# Patient Record
Sex: Male | Born: 1990 | Race: Black or African American | Hispanic: No | Marital: Single | Smoking: Former smoker
Health system: Southern US, Community
[De-identification: ages and names within clinical notes are randomized; demographics above are authoritative.]

## PROBLEM LIST (undated history)

## (undated) DIAGNOSIS — Z789 Other specified health status: Secondary | ICD-10-CM

## (undated) HISTORY — PX: WISDOM TOOTH EXTRACTION: SHX21

## (undated) HISTORY — PX: APPENDECTOMY: SHX54

---

## 2009-08-18 ENCOUNTER — Emergency Department: Payer: Self-pay | Admitting: Emergency Medicine

## 2011-12-01 ENCOUNTER — Emergency Department: Payer: Self-pay | Admitting: Emergency Medicine

## 2013-06-15 ENCOUNTER — Emergency Department: Payer: Self-pay | Admitting: Emergency Medicine

## 2013-06-16 ENCOUNTER — Emergency Department: Payer: Self-pay

## 2013-11-27 ENCOUNTER — Ambulatory Visit: Payer: Self-pay | Admitting: Family Medicine

## 2013-11-27 LAB — GC/CHLAMYDIA PROBE AMP

## 2017-08-25 ENCOUNTER — Emergency Department
Admission: EM | Admit: 2017-08-25 | Discharge: 2017-08-25 | Disposition: A | Discharge status: Home / Self Care | Payer: Self-pay | Attending: Emergency Medicine | Admitting: Emergency Medicine

## 2017-08-25 ENCOUNTER — Emergency Department: Payer: Self-pay

## 2017-08-25 DIAGNOSIS — L03115 Cellulitis of right lower limb: Secondary | ICD-10-CM | POA: Insufficient documentation

## 2017-08-25 DIAGNOSIS — Z87891 Personal history of nicotine dependence: Secondary | ICD-10-CM | POA: Insufficient documentation

## 2017-08-25 DIAGNOSIS — L03119 Cellulitis of unspecified part of limb: Secondary | ICD-10-CM

## 2017-08-25 MED ORDER — TRAMADOL HCL 50 MG PO TABS: 50.0000 mg | ORAL_TABLET | Freq: Four times a day (QID) | ORAL | 0 refills | Status: DC | PRN

## 2017-08-25 MED ORDER — CEPHALEXIN 500 MG PO CAPS: 500.0000 mg | ORAL_CAPSULE | Freq: Four times a day (QID) | ORAL | 0 refills | Status: DC

## 2017-08-25 MED ORDER — TRAMADOL HCL 50 MG PO TABS
50.0000 mg | ORAL_TABLET | Freq: Once | ORAL | Status: AC
  Administered 2017-08-25: 50 mg via ORAL
  Filled 2017-08-25: qty 1

## 2017-08-25 MED ORDER — CEPHALEXIN 500 MG PO CAPS
500.0000 mg | ORAL_CAPSULE | Freq: Once | ORAL | Status: AC
  Administered 2017-08-25: 500 mg via ORAL
  Filled 2017-08-25: qty 1

## 2017-08-25 MED ORDER — CEPHALEXIN 500 MG PO CAPS: 500.0000 mg | ORAL_CAPSULE | Freq: Four times a day (QID) | ORAL | 0 refills | Status: AC

## 2017-08-25 NOTE — ED Triage Notes (Addendum)
Patient c/o right foot pain/swelling X 2 days. Patient c/o redness/warmth to foot beginning today. Patient denies fall/injury.
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
## 2017-08-25 NOTE — Discharge Instructions (Addendum)

## 2017-08-25 NOTE — ED Notes (Signed)
Red area on right foot marked with skin pen.  Pt advised to watch area and return for any extensive swelling or redness.  Pt verbalized understanding.

## 2017-08-25 NOTE — ED Provider Notes (Signed)
[facility] Emergency Department Provider Note  ____________________________________________  Time seen: Approximately 7:09 AM  I have reviewed the triage vital signs and the nursing notes.   HISTORY  Chief Complaint Foot Pain   HPI Mark Charna BusmanJermaine Allison is a 27 y.o. male with no significant past medical history who presents for evaluation of right foot pain and swelling.  Patient noticed a raised area on the dorsal aspect of his right foot few days ago.  He reports that initially had a little whitehead however that drained by itself.  It was not pruritic in nature.  This morning when he woke up he noticed that the foot was red and swollen and painful.  His complain of 7 out of 10 throbbing pain that is worse with weightbearing.  The redness has spread since this morning.  No fever or chills, no nausea or vomiting.  Patient is not a diabetic.  He denies trauma to his foot.   Past Surgical History:  Procedure Laterality Date  . APPENDECTOMY    . WISDOM TOOTH EXTRACTION      Allergies Patient has no known allergies.  No family history on file.  Social History Social History   Tobacco Use  . Smoking status: Former Games developermoker  . Smokeless tobacco: Never Used  Substance Use Topics  . Alcohol use: Yes  . Drug use: Never    Review of Systems  Constitutional: Negative for fever. Eyes: Negative for visual changes. ENT: Negative for sore throat. Neck: No neck pain  Cardiovascular: Negative for chest pain. Respiratory: Negative for shortness of breath. Gastrointestinal: Negative for abdominal pain, vomiting or diarrhea. Genitourinary: Negative for dysuria. Musculoskeletal: Negative for back pain. + pain and redness of the R foot Skin: Negative for rash. Neurological: Negative for headaches, weakness or numbness. Psych: No SI or HI  ____________________________________________   PHYSICAL EXAM:  VITAL SIGNS: ED Triage Vitals [08/25/17 0640]    Enc Vitals Group     BP 135/83     Pulse Rate 90     Resp 18     Temp 98.4 F (36.9 C)     Temp Source Oral     SpO2 96 %     Weight 210 lb (95.3 kg)     Height 6\' 2"  (1.88 m)     Head Circumference      Peak Flow      Pain Score 8     Pain Loc      Pain Edu?      Excl. in GC?     Constitutional: Alert and oriented. Well appearing and in no apparent distress. HEENT:      Head: Normocephalic and atraumatic.         Eyes: Conjunctivae are normal. Sclera is non-icteric.       Mouth/Throat: Mucous membranes are moist.       Neck: Supple with no signs of meningismus. Cardiovascular: Regular rate and rhythm. No murmurs, gallops, or rubs. 2+ symmetrical distal pulses are present in all extremities. No JVD. Respiratory: Normal respiratory effort. Lungs are clear to auscultation bilaterally. No wheezes, crackles, or rhonchi.  Musculoskeletal: Dorsum of the right foot is erythematous and warm, no abscess, crepitus, or fluctuance, diffusely tender to palpation Neurologic: Normal speech and language. Face is symmetric. Moving all extremities. No gross focal neurologic deficits are appreciated. Skin: Skin is warm, dry and intact. No rash noted. Psychiatric: Mood and affect are normal. Speech and behavior are normal.  ____________________________________________   LABS (  all labs ordered are listed, but only abnormal results are displayed)  Labs Reviewed - No data to display ____________________________________________  EKG  none  ____________________________________________  RADIOLOGY  I have personally reviewed the images performed during this visit and I agree with the Radiologist's read.   Interpretation by Radiologist:  Dg Foot Complete Right  Result Date: 08/25/2017 CLINICAL DATA:  Dorsal foot pain. EXAM: RIGHT FOOT COMPLETE - 3+ VIEW COMPARISON:  None. FINDINGS: There is no evidence of fracture or dislocation. There is no evidence of arthropathy or other focal bone  abnormality. Soft tissues are unremarkable. IMPRESSION: Normal radiographs. Electronically Signed   By: Paulina Fusi M.D.   On: 08/25/2017 07:08     ____________________________________________   PROCEDURES  Procedure(s) performed: None Procedures Critical Care performed:  None ____________________________________________   INITIAL IMPRESSION / ASSESSMENT AND PLAN / ED COURSE   27 y.o. male with no significant past medical history who presents for evaluation of right foot pain and swelling.  Presentation is concerning for nonpurulent cellulitis.  Patient is otherwise young and healthy with no fever no systemic signs.  X-ray showing no foreign body.  Area was demarcated and patient was started on Keflex.  Discussed return precautions for spreading of the redness, nausea or vomiting, or fever.  Patient be provided with a short prescription for tramadol for pain.      As part of my medical decision making, I reviewed the following data within the electronic MEDICAL RECORD NUMBER Nursing notes reviewed and incorporated, Radiograph reviewed , Notes from prior ED visits and [place] Controlled Substance Database    Pertinent labs & imaging results that were available during my care of the patient were reviewed by me and considered in my medical decision making (see chart for details).    ____________________________________________   FINAL CLINICAL IMPRESSION(S) / ED DIAGNOSES  Final diagnoses:  Cellulitis of foot      NEW MEDICATIONS STARTED DURING THIS VISIT:  ED Discharge Orders        Ordered    cephALEXin (KEFLEX) 500 MG capsule  4 times daily     08/25/17 0721    traMADol (ULTRAM) 50 MG tablet  Every 6 hours PRN     08/25/17 1610       Note:  This document was prepared using Dragon voice recognition software and may include unintentional dictation errors.    Nita Sickle, MD 08/25/17 [phone]

## 2017-10-26 ENCOUNTER — Encounter: Payer: Self-pay | Admitting: Emergency Medicine

## 2017-10-26 ENCOUNTER — Other Ambulatory Visit: Payer: Self-pay

## 2017-10-26 ENCOUNTER — Emergency Department
Admission: EM | Admit: 2017-10-26 | Discharge: 2017-10-26 | Disposition: A | Discharge status: Home / Self Care | Payer: Self-pay | Attending: Emergency Medicine | Admitting: Emergency Medicine

## 2017-10-26 DIAGNOSIS — Z87891 Personal history of nicotine dependence: Secondary | ICD-10-CM | POA: Insufficient documentation

## 2017-10-26 DIAGNOSIS — K529 Noninfective gastroenteritis and colitis, unspecified: Secondary | ICD-10-CM | POA: Insufficient documentation

## 2017-10-26 MED ORDER — ONDANSETRON 4 MG PO TBDP: 4.0000 mg | ORAL_TABLET | Freq: Three times a day (TID) | ORAL | 0 refills | Status: DC | PRN

## 2017-10-26 MED ORDER — ONDANSETRON 4 MG PO TBDP
4.0000 mg | ORAL_TABLET | Freq: Once | ORAL | Status: AC
  Administered 2017-10-26: 4 mg via ORAL
  Filled 2017-10-26: qty 1

## 2017-10-26 NOTE — Discharge Instructions (Addendum)
Follow-up with your primary care provider if any continued problems.  Remain on clear liquids today and slowly add foods tomorrow such as bananas, rice, applesauce and plain toast.  Zofran if needed for nausea every 8 hours.  Tylenol if needed for fever.
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
## 2017-10-26 NOTE — ED Triage Notes (Signed)
Says this am has diarrhea and vomiting.  Could not go to work.  (vomx2)  Says his mother was feeling ill yesterday.  Pt in nad.

## 2017-10-26 NOTE — ED Notes (Signed)
See triage note  States he developed some vomiting and diarrhea yesterday  States he vomited times 2

## 2018-03-11 ENCOUNTER — Emergency Department (HOSPITAL_COMMUNITY)
Admission: EM | Admit: 2018-03-11 | Discharge: 2018-03-11 | Disposition: A | Discharge status: Home / Self Care | Payer: No Typology Code available for payment source | Attending: Emergency Medicine | Admitting: Emergency Medicine

## 2018-03-11 ENCOUNTER — Emergency Department (HOSPITAL_COMMUNITY): Payer: No Typology Code available for payment source

## 2018-03-11 ENCOUNTER — Other Ambulatory Visit: Payer: Self-pay

## 2018-03-11 ENCOUNTER — Encounter (HOSPITAL_COMMUNITY): Payer: Self-pay | Admitting: Emergency Medicine

## 2018-03-11 DIAGNOSIS — R4182 Altered mental status, unspecified: Secondary | ICD-10-CM | POA: Diagnosis not present

## 2018-03-11 DIAGNOSIS — Z87891 Personal history of nicotine dependence: Secondary | ICD-10-CM | POA: Diagnosis not present

## 2018-03-11 DIAGNOSIS — S30811A Abrasion of abdominal wall, initial encounter: Secondary | ICD-10-CM | POA: Insufficient documentation

## 2018-03-11 DIAGNOSIS — Y999 Unspecified external cause status: Secondary | ICD-10-CM | POA: Insufficient documentation

## 2018-03-11 DIAGNOSIS — Z23 Encounter for immunization: Secondary | ICD-10-CM | POA: Diagnosis not present

## 2018-03-11 DIAGNOSIS — Y92410 Unspecified street and highway as the place of occurrence of the external cause: Secondary | ICD-10-CM | POA: Diagnosis not present

## 2018-03-11 DIAGNOSIS — H5713 Ocular pain, bilateral: Secondary | ICD-10-CM | POA: Diagnosis not present

## 2018-03-11 DIAGNOSIS — S8991XA Unspecified injury of right lower leg, initial encounter: Secondary | ICD-10-CM | POA: Diagnosis present

## 2018-03-11 DIAGNOSIS — S8391XA Sprain of unspecified site of right knee, initial encounter: Secondary | ICD-10-CM | POA: Diagnosis not present

## 2018-03-11 DIAGNOSIS — S63502A Unspecified sprain of left wrist, initial encounter: Secondary | ICD-10-CM

## 2018-03-11 DIAGNOSIS — T07XXXA Unspecified multiple injuries, initial encounter: Secondary | ICD-10-CM

## 2018-03-11 DIAGNOSIS — Y9389 Activity, other specified: Secondary | ICD-10-CM | POA: Insufficient documentation

## 2018-03-11 MED ORDER — HYDROCODONE-ACETAMINOPHEN 5-325 MG PO TABS
1.0000 | ORAL_TABLET | Freq: Once | ORAL | Status: AC
  Administered 2018-03-11: 1 via ORAL
  Filled 2018-03-11: qty 1

## 2018-03-11 MED ORDER — TETANUS-DIPHTH-ACELL PERTUSSIS 5-2.5-18.5 LF-MCG/0.5 IM SUSP
0.5000 mL | Freq: Once | INTRAMUSCULAR | Status: AC
Start: 2018-03-11 — End: 2018-03-11
  Administered 2018-03-11: 0.5 mL via INTRAMUSCULAR
  Filled 2018-03-11: qty 0.5

## 2018-03-11 MED ORDER — TETRACAINE HCL 0.5 % OP SOLN
1.0000 [drp] | Freq: Once | OPHTHALMIC | Status: AC
  Administered 2018-03-11: 1 [drp] via OPHTHALMIC
  Filled 2018-03-11: qty 4

## 2018-03-11 MED ORDER — SILVER SULFADIAZINE 1 % EX CREA
TOPICAL_CREAM | Freq: Once | CUTANEOUS | Status: AC
  Administered 2018-03-11: 1 via TOPICAL
  Filled 2018-03-11: qty 85

## 2018-03-11 MED ORDER — METHOCARBAMOL 500 MG PO TABS: 500.0000 mg | ORAL_TABLET | Freq: Two times a day (BID) | ORAL | 0 refills | Status: DC

## 2018-03-11 MED ORDER — ERYTHROMYCIN 5 MG/GM OP OINT: TOPICAL_OINTMENT | OPHTHALMIC | 0 refills | Status: DC

## 2018-03-11 MED ORDER — FLUORESCEIN SODIUM 1 MG OP STRP
1.0000 | ORAL_STRIP | Freq: Once | OPHTHALMIC | Status: AC
  Administered 2018-03-11: 1 via OPHTHALMIC
  Filled 2018-03-11: qty 1

## 2018-03-11 MED ORDER — ACETAMINOPHEN 500 MG PO TABS
1000.0000 mg | ORAL_TABLET | Freq: Once | ORAL | Status: AC
  Administered 2018-03-11: 1000 mg via ORAL
  Filled 2018-03-11: qty 2

## 2018-03-11 MED ORDER — TETRACAINE HCL 0.5 % OP SOLN
1.0000 [drp] | Freq: Once | OPHTHALMIC | Status: AC
  Administered 2018-03-11: 1 [drp] via OPHTHALMIC

## 2018-03-11 NOTE — ED Notes (Signed)
Pt endorses ETOH and marijuana tonight

## 2018-03-11 NOTE — ED Notes (Signed)
ED Provider at bedside. 
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
## 2018-03-11 NOTE — Progress Notes (Signed)
Discharge instructions (including medications) discussed with and copy provided to patient/caregiver. Pt unable to sign due to lack of signature pad.   Pt d/c with family to home.
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
## 2018-03-11 NOTE — Progress Notes (Signed)
Orthopedic Tech Progress Note Patient Details:  Mark Allison Mar 09, 1990 [id number]  Ortho Devices Type of Ortho Device: Crutches, Knee Immobilizer, Velcro wrist splint Ortho Device/Splint Interventions: Adjustment, Application, Ordered   Post Interventions Patient Tolerated: Well Instructions Provided: Care of device, Adjustment of device   Donald Pore 03/11/2018, 10:29 AM

## 2018-03-22 ENCOUNTER — Emergency Department (HOSPITAL_COMMUNITY)
Admission: EM | Admit: 2018-03-22 | Discharge: 2018-03-22 | Disposition: A | Discharge status: Home / Self Care | Payer: Self-pay | Attending: Emergency Medicine | Admitting: Emergency Medicine

## 2018-03-22 ENCOUNTER — Emergency Department (HOSPITAL_BASED_OUTPATIENT_CLINIC_OR_DEPARTMENT_OTHER): Payer: Self-pay

## 2018-03-22 ENCOUNTER — Encounter (HOSPITAL_COMMUNITY): Payer: Self-pay | Admitting: Emergency Medicine

## 2018-03-22 ENCOUNTER — Emergency Department (HOSPITAL_COMMUNITY): Payer: Self-pay

## 2018-03-22 ENCOUNTER — Other Ambulatory Visit: Payer: Self-pay

## 2018-03-22 DIAGNOSIS — R600 Localized edema: Secondary | ICD-10-CM | POA: Insufficient documentation

## 2018-03-22 DIAGNOSIS — M25461 Effusion, right knee: Secondary | ICD-10-CM | POA: Insufficient documentation

## 2018-03-22 DIAGNOSIS — Z87891 Personal history of nicotine dependence: Secondary | ICD-10-CM | POA: Insufficient documentation

## 2018-03-22 DIAGNOSIS — Z79899 Other long term (current) drug therapy: Secondary | ICD-10-CM | POA: Insufficient documentation

## 2018-03-22 DIAGNOSIS — M25561 Pain in right knee: Secondary | ICD-10-CM | POA: Insufficient documentation

## 2018-03-22 DIAGNOSIS — R609 Edema, unspecified: Secondary | ICD-10-CM

## 2018-03-22 MED ORDER — IBUPROFEN 800 MG PO TABS
800.0000 mg | ORAL_TABLET | Freq: Once | ORAL | Status: AC
  Administered 2018-03-22: 800 mg via ORAL
  Filled 2018-03-22: qty 1

## 2018-03-22 MED ORDER — KETOROLAC TROMETHAMINE 15 MG/ML IJ SOLN
15.0000 mg | Freq: Once | INTRAMUSCULAR | Status: DC
  Filled 2018-03-22: qty 1

## 2018-03-22 MED ORDER — IBUPROFEN 400 MG PO TABS: 600.0000 mg | ORAL_TABLET | Freq: Once | ORAL | Status: DC

## 2018-03-22 NOTE — Discharge Instructions (Addendum)
Use the Ace wrap and crutches for weightbearing as tolerated.  Rest, ice, compress and elevate the area.  You probably need an MRI of your right knee.  Follow-up with orthopedics have given your referral.  Return to ED with worsening symptoms.  Can take Motrin and Tylenol around-the-clock to help with your pain.
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
## 2018-03-22 NOTE — ED Notes (Signed)
Patient verbalizes understanding of discharge instructions. Opportunity for questioning and answers were provided. Armband removed by staff, pt discharged from ED.  

## 2018-03-22 NOTE — Progress Notes (Signed)
RLE venous duplex       has been completed. Preliminary results can be found under CV proc through chart review. Denissa Cozart, BS, RDMS, RVT   
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
## 2019-01-23 ENCOUNTER — Emergency Department
Admission: EM | Admit: 2019-01-23 | Discharge: 2019-01-23 | Disposition: A | Discharge status: Home / Self Care | Payer: Self-pay | Attending: Emergency Medicine | Admitting: Emergency Medicine

## 2019-01-23 ENCOUNTER — Emergency Department: Payer: Self-pay

## 2019-01-23 ENCOUNTER — Other Ambulatory Visit: Payer: Self-pay

## 2019-01-23 DIAGNOSIS — F1210 Cannabis abuse, uncomplicated: Secondary | ICD-10-CM | POA: Insufficient documentation

## 2019-01-23 DIAGNOSIS — Z87891 Personal history of nicotine dependence: Secondary | ICD-10-CM | POA: Insufficient documentation

## 2019-01-23 DIAGNOSIS — U071 COVID-19: Secondary | ICD-10-CM | POA: Insufficient documentation

## 2019-01-23 DIAGNOSIS — J069 Acute upper respiratory infection, unspecified: Secondary | ICD-10-CM | POA: Insufficient documentation

## 2019-01-23 LAB — BASIC METABOLIC PANEL
Anion gap: 11 (ref 5–15)
BUN: 12 mg/dL (ref 6–20)
CO2: 27 mmol/L (ref 22–32)
Calcium: 9.5 mg/dL (ref 8.9–10.3)
Chloride: 97 mmol/L — ABNORMAL LOW (ref 98–111)
Creatinine, Ser: 1.15 mg/dL (ref 0.61–1.24)
GFR calc Af Amer: >60 mL/min (ref >60)
GFR calc non Af Amer: >60 mL/min (ref >60)
Glucose, Bld: 118 mg/dL — ABNORMAL HIGH (ref 70–99)
Potassium: 4.2 mmol/L (ref 3.5–5.1)
Sodium: 135 mmol/L (ref 135–145)

## 2019-01-23 LAB — CBC
HCT: 50 % (ref 39.0–52.0)
Hemoglobin: 18.2 g/dL — ABNORMAL HIGH (ref 13.0–17.0)
MCH: 31.4 pg (ref 26.0–34.0)
MCHC: 36.4 g/dL — ABNORMAL HIGH (ref 30.0–36.0)
MCV: 86.2 fL (ref 80.0–100.0)
Platelets: 160 10*3/uL (ref 150–400)
RBC: 5.8 MIL/uL (ref 4.22–5.81)
RDW: 11.9 % (ref 11.5–15.5)
WBC: 4.9 10*3/uL (ref 4.0–10.5)
nRBC: 0 % (ref 0.0–0.2)

## 2019-01-23 LAB — TROPONIN I (HIGH SENSITIVITY): Troponin I (High Sensitivity): 3 ng/L (ref <18)

## 2019-01-23 MED ORDER — BENZONATATE 100 MG PO CAPS: 100.0000 mg | ORAL_CAPSULE | Freq: Three times a day (TID) | ORAL | 0 refills | Status: DC | PRN

## 2019-01-23 MED ORDER — ACETAMINOPHEN 500 MG PO TABS
1000.0000 mg | ORAL_TABLET | Freq: Once | ORAL | Status: AC
  Administered 2019-01-23: 1000 mg via ORAL
  Filled 2019-01-23: qty 2

## 2019-01-23 MED ORDER — AZITHROMYCIN 250 MG PO TABS: ORAL_TABLET | ORAL | 0 refills | Status: DC

## 2019-01-23 MED ORDER — SODIUM CHLORIDE 0.9% FLUSH: 3.0000 mL | Freq: Once | INTRAVENOUS | Status: DC

## 2019-01-23 MED ORDER — DEXAMETHASONE 4 MG PO TABS
10.0000 mg | ORAL_TABLET | Freq: Once | ORAL | Status: AC
  Administered 2019-01-23: 10 mg via ORAL
  Filled 2019-01-23 (×2): qty 2.5

## 2019-01-23 MED ORDER — PROMETHAZINE-DM 6.25-15 MG/5ML PO SYRP: 5.0000 mL | ORAL_SOLUTION | Freq: Three times a day (TID) | ORAL | 0 refills | Status: DC | PRN

## 2019-01-23 NOTE — Discharge Instructions (Addendum)
Person Under Monitoring Name: Mark Allison  Location: [place] [id number]   Infection Prevention Recommendations for Individuals Confirmed to have, or Being Evaluated for, 2019 Novel Coronavirus (COVID-19) Infection Who Receive Care at Home  Individuals who are confirmed to have, or are being evaluated for, COVID-19 should follow the prevention steps below until a healthcare provider or local or state health department says they can return to normal activities.  Stay home except to get medical care You should restrict activities outside your home, except for getting medical care. Do not go to work, school, or public areas, and do not use public transportation or taxis.  Call ahead before visiting your doctor Before your medical appointment, call the healthcare provider and tell them that you have, or are being evaluated for, COVID-19 infection. This will help the healthcare providers office take steps to keep other people from getting infected. Ask your healthcare provider to call the local or state health department.  Monitor your symptoms Seek prompt medical attention if your illness is worsening (e.g., difficulty breathing). Before going to your medical appointment, call the healthcare provider and tell them that you have, or are being evaluated for, COVID-19 infection. Ask your healthcare provider to call the local or state health department.  Wear a facemask You should wear a facemask that covers your nose and mouth when you are in the same room with other people and when you visit a healthcare provider. People who live with or visit you should also wear a facemask while they are in the same room with you.  Separate yourself from other people in your home As much as possible, you should stay in a different room from other people in your home. Also, you should use a separate bathroom, if available.  Avoid sharing household items You  should not share dishes, drinking glasses, cups, eating utensils, towels, bedding, or other items with other people in your home. After using these items, you should wash them thoroughly with soap and water.  Cover your coughs and sneezes Cover your mouth and nose with a tissue when you cough or sneeze, or you can cough or sneeze into your sleeve. Throw used tissues in a lined trash can, and immediately wash your hands with soap and water for at least 20 seconds or use an alcohol-based hand rub.  Wash your [facility] your hands often and thoroughly with soap and water for at least 20 seconds. You can use an alcohol-based hand sanitizer if soap and water are not available and if your hands are not visibly dirty. Avoid touching your eyes, nose, and mouth with unwashed hands.   Prevention Steps for Caregivers and Household Members of Individuals Confirmed to have, or Being Evaluated for, COVID-19 Infection Being Cared for in the Home  If you live with, or provide care at home for, a person confirmed to have, or being evaluated for, COVID-19 infection please follow these guidelines to prevent infection:  Follow healthcare providers instructions Make sure that you understand and can help the patient follow any healthcare provider instructions for all care.  Provide for the patients basic needs You should help the patient with basic needs in the home and provide support for getting groceries, prescriptions, and other personal needs.  Monitor the patients symptoms If they are getting sicker, call his or her medical provider and tell them that the patient has, or is being evaluated for, COVID-19 infection. This will help the healthcare  providers office take steps to keep other people from getting infected. Ask the healthcare provider to call the local or state health department.  Limit the number of people who have contact with the patient If possible, have only one caregiver for the  patient. Other household members should stay in another home or place of residence. If this is not possible, they should stay in another room, or be separated from the patient as much as possible. Use a separate bathroom, if available. Restrict visitors who do not have an essential need to be in the home.  Keep older adults, very young children, and other sick people away from the patient Keep older adults, very young children, and those who have compromised immune systems or chronic health conditions away from the patient. This includes people with chronic heart, lung, or kidney conditions, diabetes, and cancer.  Ensure good ventilation Make sure that shared spaces in the home have good air flow, such as from an air conditioner or an opened window, weather permitting.  Wash your hands often Wash your hands often and thoroughly with soap and water for at least 20 seconds. You can use an alcohol based hand sanitizer if soap and water are not available and if your hands are not visibly dirty. Avoid touching your eyes, nose, and mouth with unwashed hands. Use disposable paper towels to dry your hands. If not available, use dedicated cloth towels and replace them when they become wet.  Wear a facemask and gloves Wear a disposable facemask at all times in the room and gloves when you touch or have contact with the patients blood, body fluids, and/or secretions or excretions, such as sweat, saliva, sputum, nasal mucus, vomit, urine, or feces.  Ensure the mask fits over your nose and mouth tightly, and do not touch it during use. Throw out disposable facemasks and gloves after using them. Do not reuse. Wash your hands immediately after removing your facemask and gloves. If your personal clothing becomes contaminated, carefully remove clothing and launder. Wash your hands after handling contaminated clothing. Place all used disposable facemasks, gloves, and other waste in a lined container before  disposing them with other household waste. Remove gloves and wash your hands immediately after handling these items.  Do not share dishes, glasses, or other household items with the patient Avoid sharing household items. You should not share dishes, drinking glasses, cups, eating utensils, towels, bedding, or other items with a patient who is confirmed to have, or being evaluated for, COVID-19 infection. After the person uses these items, you should wash them thoroughly with soap and water.  Wash laundry thoroughly Immediately remove and wash clothes or bedding that have blood, body fluids, and/or secretions or excretions, such as sweat, saliva, sputum, nasal mucus, vomit, urine, or feces, on them. Wear gloves when handling laundry from the patient. Read and follow directions on labels of laundry or clothing items and detergent. In general, wash and dry with the warmest temperatures recommended on the label.  Clean all areas the individual has used often Clean all touchable surfaces, such as counters, tabletops, doorknobs, bathroom fixtures, toilets, phones, keyboards, tablets, and bedside tables, every day. Also, clean any surfaces that may have blood, body fluids, and/or secretions or excretions on them. Wear gloves when cleaning surfaces the patient has come in contact with. Use a diluted bleach solution (e.g., dilute bleach with 1 part bleach and 10 parts water) or a household disinfectant with a label that says EPA-registered for coronaviruses. To make  a bleach solution at home, add 1 tablespoon of bleach to 1 quart (4 cups) of water. For a larger supply, add  cup of bleach to 1 gallon (16 cups) of water. Read labels of cleaning products and follow recommendations provided on product labels. Labels contain instructions for safe and effective use of the cleaning product including precautions you should take when applying the product, such as wearing gloves or eye protection and making sure you  have good ventilation during use of the product. Remove gloves and wash hands immediately after cleaning.  Monitor yourself for signs and symptoms of illness Caregivers and household members are considered close contacts, should monitor their health, and will be asked to limit movement outside of the home to the extent possible. Follow the monitoring steps for close contacts listed on the symptom monitoring form.   ? If you have additional questions, contact your local health department or call the epidemiologist on call at [phone] (available 24/7). ? This guidance is subject to change. For the most up-to-date guidance from [facility], please refer to their website: [email or web address]

## 2019-01-23 NOTE — ED Provider Notes (Signed)
[facility] Emergency Department Provider Note  ____________________________________________   First MD Initiated Contact with Patient 01/23/19 1638     (approximate)  I have reviewed the triage vital signs and the nursing notes.   HISTORY  Chief Complaint Shortness of Breath    HPI Mark Allison is a 28 y.o. male       28 yo M here with cough, mild SOB, fever, rhinorrhea. Pt reports that his sx started 4 days ago as mild sore throat, rhinorrhea, and dry cough. Since then, he's had persistent dry cough without significant SOB. He reports he has developed worsening fever, chills over the past 48 hours. His SOB is w/ exertion only and minimal, not at rest. His cough has persisted. Denies known sick contacts but he has been "out and about." No abd pain but has had some loose BMs. No nausea, vomiting. No abd pain.   History reviewed. No pertinent past medical history.  There are no active problems to display for this patient.   Past Surgical History:  Procedure Laterality Date  . APPENDECTOMY    . WISDOM TOOTH EXTRACTION      Prior to Admission medications   Medication Sig Start Date End Date Taking? Authorizing Provider  erythromycin ophthalmic ointment Place a 1/2 inch ribbon of ointment into the lower eyelid four times a day x 5 days 03/11/18   Maxwell CaulLayden, Lindsey A, PA-C  methocarbamol (ROBAXIN) 500 MG tablet Take 1 tablet (500 mg total) by mouth 2 (two) times daily. 03/11/18   Maxwell CaulLayden, Lindsey A, PA-C  ondansetron (ZOFRAN ODT) 4 MG disintegrating tablet Take 1 tablet (4 mg total) by mouth every 8 (eight) hours as needed for nausea or vomiting. 10/26/17   Tommi RumpsSummers, Rhonda L, PA-C    Allergies Patient has no known allergies.  No family history on file.  Social History Social History   Tobacco Use  . Smoking status: Former Games developermoker  . Smokeless tobacco: Never Used  Substance Use Topics  . Alcohol use: Yes  . Drug use: Yes    Types:  Marijuana    Review of Systems  Review of Systems  Constitutional: Positive for chills and fatigue. Negative for fever.  HENT: Positive for congestion and rhinorrhea. Negative for sore throat.   Respiratory: Positive for cough and shortness of breath.   Cardiovascular: Negative for chest pain.  Gastrointestinal: Positive for diarrhea. Negative for abdominal pain.  Genitourinary: Negative for flank pain.  Musculoskeletal: Negative for neck pain.  Skin: Negative for rash and wound.  Allergic/Immunologic: Negative for immunocompromised state.  Neurological: Negative for weakness and numbness.  Hematological: Does not bruise/bleed easily.  All other systems reviewed and are negative.    ____________________________________________  PHYSICAL EXAM:      VITAL SIGNS: ED Triage Vitals  Enc Vitals Group     BP 01/23/19 1521 132/73     Pulse Rate 01/23/19 1521 (!) 110     Resp 01/23/19 1521 18     Temp 01/23/19 1521 99.4 F (37.4 C)     Temp src --      SpO2 01/23/19 1521 100 %     Weight 01/23/19 1522 205 lb (93 kg)     Height 01/23/19 1522 6\' 3"  (1.905 m)     Head Circumference --      Peak Flow --      Pain Score 01/23/19 1522 5     Pain Loc --      Pain Edu? --  Excl. in GC? --      Physical Exam Vitals signs and nursing note reviewed.  Constitutional:      General: He is not in acute distress.    Appearance: He is well-developed.  HENT:     Head: Normocephalic and atraumatic.  Eyes:     Conjunctiva/sclera: Conjunctivae normal.  Neck:     Musculoskeletal: Neck supple.  Cardiovascular:     Rate and Rhythm: Regular rhythm. Tachycardia present.     Heart sounds: Normal heart sounds. No murmur. No friction rub.  Pulmonary:     Effort: Pulmonary effort is normal. No respiratory distress.     Breath sounds: Rales (scattered, minimal and clear w/ coughing) present. No wheezing.  Abdominal:     General: There is no distension.     Palpations: Abdomen is soft.      Tenderness: There is no abdominal tenderness.  Skin:    General: Skin is warm.     Capillary Refill: Capillary refill takes less than 2 seconds.  Neurological:     Mental Status: He is alert and oriented to person, place, and time.     Motor: No abnormal muscle tone.       ____________________________________________   LABS (all labs ordered are listed, but only abnormal results are displayed)  Labs Reviewed  BASIC METABOLIC PANEL - Abnormal; Notable for the following components:      Result Value   Chloride 97 (*)    Glucose, Bld 118 (*)    All other components within normal limits  CBC - Abnormal; Notable for the following components:   Hemoglobin 18.2 (*)    MCHC 36.4 (*)    All other components within normal limits  SARS CORONAVIRUS 2 (TAT 6-24 HRS)  TROPONIN I (HIGH SENSITIVITY)  TROPONIN I (HIGH SENSITIVITY)    ____________________________________________  EKG: Sinus tachycardia, VR 107. PR 142, QRS 66, QTc 411. No ST elevations or depressions. No signs of ischemia or infarct. ________________________________________  RADIOLOGY All imaging, including plain films, CT scans, and ultrasounds, independently reviewed by me, and interpretations confirmed via formal radiology reads.  ED MD interpretation:   CXR: Clear, no PNA  Official radiology report(s): Dg Chest 2 View  Result Date: 01/23/2019 CLINICAL DATA:  Shortness of breath EXAM: CHEST - 2 VIEW COMPARISON:  03/11/2018 FINDINGS: The heart size and mediastinal contours are within normal limits. Both lungs are clear. The visualized skeletal structures are unremarkable. IMPRESSION: No active cardiopulmonary disease. Electronically Signed   By: Jasmine Pang M.D.   On: 01/23/2019 15:39    ____________________________________________  PROCEDURES   Procedure(s) performed (including Critical Care):  Procedures  ____________________________________________  INITIAL IMPRESSION / MDM / ASSESSMENT AND PLAN / ED  COURSE  As part of my medical decision making, I reviewed the following data within the electronic MEDICAL RECORD NUMBER Nursing notes reviewed and incorporated, Old chart reviewed, Notes from prior ED visits, and [place] Controlled Substance Database       *Mark Allison was evaluated in Emergency Department on 01/23/2019 for the symptoms described in the history of present illness. He was evaluated in the context of the global COVID-19 pandemic, which necessitated consideration that the patient might be at risk for infection with the SARS-CoV-2 virus that causes COVID-19. Institutional protocols and algorithms that pertain to the evaluation of patients at risk for COVID-19 are in a state of rapid change based on information released by regulatory bodies including the CDC and federal and state organizations. These policies and  algorithms were followed during the patient's care in the ED.  Some ED evaluations and interventions may be delayed as a result of limited staffing during the pandemic.*     Medical Decision Making:  28 yo M here with cough, fever, mild SOB w/ exertion. Pt is well appearing in NAD on exam here. Normal WOB. Satting 100% on RA. He is mildly tachycardic which I suspect is 2/2 low-grade temp. Pt has sx highly c/w COVID-19. He is not hypoxic and has no signs of moderate or severe dz. No underlying risk factors. Otherwise, CXR is without significant focal PNA. His labs are reassuring. EKG is nonischemic and trop neg, doubt ACS, pericarditis, myocarditis. No pleurisy, hemoptysis, leg swelling or sx to suggest PE. Will tx for COVID with dose of decadron (hodl on outpt course given absence of hypoxia), supportive care, and good return precautions.  ____________________________________________  FINAL CLINICAL IMPRESSION(S) / ED DIAGNOSES  Final diagnoses:  Viral URI     MEDICATIONS GIVEN DURING THIS VISIT:  Medications  sodium chloride flush (NS) 0.9 % injection 3 mL (has no  administration in time range)  dexamethasone (DECADRON) tablet 10 mg (has no administration in time range)  acetaminophen (TYLENOL) tablet 1,000 mg (has no administration in time range)     ED Discharge Orders    None       Note:  This document was prepared using Dragon voice recognition software and may include unintentional dictation errors.   Duffy Bruce, MD 01/23/19 1726

## 2019-01-23 NOTE — ED Triage Notes (Signed)
Pt comes via POV with c/o fever and some SOB that started Sunday morning. Pt states he went outside yesterday and the cold air made he start to feel worse.  Pt states last night he started with a fever and SOB.  Pt states dry cough. Pt unsure if he has been exposed to [place].

## 2019-01-24 ENCOUNTER — Telehealth: Payer: Self-pay | Admitting: Emergency Medicine

## 2019-01-24 LAB — SARS CORONAVIRUS 2 (TAT 6-24 HRS): SARS Coronavirus 2: POSITIVE — AB

## 2019-01-24 NOTE — Telephone Encounter (Addendum)
Called pateint to inform of positive covid 19 result.  No answer and no voicemail   Patient called me back and he is aware of results and isolation guidelines.

## 2019-06-27 ENCOUNTER — Emergency Department: Payer: Self-pay

## 2019-06-27 ENCOUNTER — Emergency Department
Admission: EM | Admit: 2019-06-27 | Discharge: 2019-06-28 | Disposition: A | Discharge status: Home / Self Care | Payer: Self-pay | Attending: Emergency Medicine | Admitting: Emergency Medicine

## 2019-06-27 ENCOUNTER — Other Ambulatory Visit: Payer: Self-pay

## 2019-06-27 ENCOUNTER — Encounter: Payer: Self-pay | Admitting: Emergency Medicine

## 2019-06-27 DIAGNOSIS — Z87891 Personal history of nicotine dependence: Secondary | ICD-10-CM | POA: Insufficient documentation

## 2019-06-27 DIAGNOSIS — Z23 Encounter for immunization: Secondary | ICD-10-CM | POA: Insufficient documentation

## 2019-06-27 DIAGNOSIS — L03115 Cellulitis of right lower limb: Secondary | ICD-10-CM | POA: Insufficient documentation

## 2019-06-27 LAB — CBC WITH DIFFERENTIAL/PLATELET
Abs Immature Granulocytes: 0.02 10*3/uL (ref 0.00–0.07)
Basophils Absolute: 0 10*3/uL (ref 0.0–0.1)
Basophils Relative: 0 %
Eosinophils Absolute: 0.6 10*3/uL — ABNORMAL HIGH (ref 0.0–0.5)
Eosinophils Relative: 8 %
HCT: 44.7 % (ref 39.0–52.0)
Hemoglobin: 15.8 g/dL (ref 13.0–17.0)
Immature Granulocytes: 0 %
Lymphocytes Relative: 43 %
Lymphs Abs: 3.1 10*3/uL (ref 0.7–4.0)
MCH: 31.5 pg (ref 26.0–34.0)
MCHC: 35.3 g/dL (ref 30.0–36.0)
MCV: 89.2 fL (ref 80.0–100.0)
Monocytes Absolute: 0.4 10*3/uL (ref 0.1–1.0)
Monocytes Relative: 5 %
Neutro Abs: 3.2 10*3/uL (ref 1.7–7.7)
Neutrophils Relative %: 44 %
Platelets: 172 10*3/uL (ref 150–400)
RBC: 5.01 MIL/uL (ref 4.22–5.81)
RDW: 12 % (ref 11.5–15.5)
WBC: 7.2 10*3/uL (ref 4.0–10.5)
nRBC: 0 % (ref 0.0–0.2)

## 2019-06-27 LAB — BASIC METABOLIC PANEL
Anion gap: 8 (ref 5–15)
BUN: 18 mg/dL (ref 6–20)
CO2: 26 mmol/L (ref 22–32)
Calcium: 9 mg/dL (ref 8.9–10.3)
Chloride: 109 mmol/L (ref 98–111)
Creatinine, Ser: 0.94 mg/dL (ref 0.61–1.24)
GFR calc Af Amer: >60 mL/min (ref >60)
GFR calc non Af Amer: >60 mL/min (ref >60)
Glucose, Bld: 99 mg/dL (ref 70–99)
Potassium: 3.8 mmol/L (ref 3.5–5.1)
Sodium: 143 mmol/L (ref 135–145)

## 2019-06-27 MED ORDER — SODIUM CHLORIDE 0.9 % IV SOLN
1.0000 g | Freq: Once | INTRAVENOUS | Status: AC
  Administered 2019-06-27: 1 g via INTRAVENOUS
  Filled 2019-06-27: qty 10

## 2019-06-27 NOTE — ED Triage Notes (Signed)
Pt to triage via w/c with no distress noted, mask in place; tatoo last Friday to rt lower leg; Monday noted redness and has had increased swelling,pain and redness

## 2019-06-27 NOTE — ED Provider Notes (Signed)
[facility] Emergency Department Provider Note  ____________________________________________   First MD Initiated Contact with Patient 06/27/19 2303     (approximate)  I have reviewed the triage vital signs and the nursing notes.   HISTORY  Chief Complaint Cellulitis    HPI Mark Allison is a 29 y.o. male with prior appendectomy who comes in for right leg redness.  Patient states that he had his tattoo done over a week ago.  On Tuesday he started noticing some redness and swelling to his leg.  This is progressed and the swelling and the redness.  The redness is constant, nothing makes it better, nothing makes it worse.  Is not on any antibiotics.  He denies it being significant amount of pain.  Denies any fevers.          History reviewed. No pertinent past medical history.  There are no problems to display for this patient.   Past Surgical History:  Procedure Laterality Date  . APPENDECTOMY    . WISDOM TOOTH EXTRACTION      Prior to Admission medications   Not on File    Allergies Patient has no known allergies.  No family history on file.  Social History Social History   Tobacco Use  . Smoking status: Former Games developer  . Smokeless tobacco: Never Used  Substance Use Topics  . Alcohol use: Yes  . Drug use: Yes    Types: Marijuana      Review of Systems Constitutional: No fever/chills Eyes: No visual changes. ENT: No sore throat. Cardiovascular: Denies chest pain. Respiratory: Denies shortness of breath. Gastrointestinal: No abdominal pain.  No nausea, no vomiting.  No diarrhea.  No constipation. Genitourinary: Negative for dysuria. Musculoskeletal: Negative for back pain.  Right leg warmth and swelling Skin: Negative for rash. Neurological: Negative for headaches, focal weakness or numbness. All other ROS negative ____________________________________________   PHYSICAL EXAM:  VITAL SIGNS: ED Triage Vitals    Enc Vitals Group     BP 06/27/19 2215 119/61     Pulse Rate 06/27/19 2215 91     Resp 06/27/19 2215 18     Temp 06/27/19 2215 97.8 F (36.6 C)     Temp Source 06/27/19 2215 Oral     SpO2 06/27/19 2215 95 %     Weight 06/27/19 2216 200 lb (90.7 kg)     Height 06/27/19 2216 6\' 2"  (1.88 m)     Head Circumference --      Peak Flow --      Pain Score 06/27/19 2216 7     Pain Loc --      Pain Edu? --      Excl. in GC? --     Constitutional: Alert and oriented. Well appearing and in no acute distress. Eyes: Conjunctivae are normal. EOMI. Head: Atraumatic. Nose: No congestion/rhinnorhea. Mouth/Throat: Mucous membranes are moist.   Neck: No stridor. Trachea Midline. FROM Cardiovascular: Normal rate, regular rhythm. Grossly normal heart sounds.  Good peripheral circulation. Respiratory: Normal respiratory effort.  No retractions. Lungs CTAB. Gastrointestinal: Soft and nontender. No distention. No abdominal bruits.  Musculoskeletal: Fresh tattoo noted to the right leg with redness and warmth noted.  Swelling also noted to the right leg.  2+ distal pulse.  No crepitus felt.  Full range of motion of all the joints Neurologic:  Normal speech and language. No gross focal neurologic deficits are appreciated.  Skin:  Skin is warm, dry and intact. No rash noted. Psychiatric: Mood  and affect are normal. Speech and behavior are normal. GU: Deferred   ____________________________________________   LABS (all labs ordered are listed, but only abnormal results are displayed)  Labs Reviewed  CBC WITH DIFFERENTIAL/PLATELET - Abnormal; Notable for the following components:      Result Value   Eosinophils Absolute 0.6 (*)    All other components within normal limits  BASIC METABOLIC PANEL   ____________________________________________  RADIOLOGY Vela Prose, personally viewed and evaluated these images (plain radiographs) as part of my medical decision making, as well as reviewing the  written report by the radiologist.  ED MD interpretation: No fracture, no free air  Official radiology report(s): DG Tibia/Fibula Right  Result Date: 06/27/2019 CLINICAL DATA:  Cellulitis redness and swelling EXAM: RIGHT TIBIA AND FIBULA - 2 VIEW COMPARISON:  None. FINDINGS: There is no evidence of fracture or other focal bone lesions. Soft tissue edema without emphysema. IMPRESSION: No acute osseous abnormality Electronically Signed   By: Jasmine Pang M.D.   On: 06/27/2019 23:37   US Venous Img Lower Unilateral Right  Result Date: 06/28/2019 CLINICAL DATA:  Initial evaluation for acute pain, redness, and swelling for 4 days. EXAM: RIGHT LOWER EXTREMITY VENOUS DOPPLER ULTRASOUND TECHNIQUE: Gray-scale sonography with graded compression, as well as color Doppler and duplex ultrasound were performed to evaluate the lower extremity deep venous systems from the level of the common femoral vein and including the common femoral, femoral, profunda femoral, popliteal and calf veins including the posterior tibial, peroneal and gastrocnemius veins when visible. The superficial great saphenous vein was also interrogated. Spectral Doppler was utilized to evaluate flow at rest and with distal augmentation maneuvers in the common femoral, femoral and popliteal veins. COMPARISON:  None. FINDINGS: Contralateral Common Femoral Vein: Respiratory phasicity is normal and symmetric with the symptomatic side. No evidence of thrombus. Normal compressibility. Common Femoral Vein: No evidence of thrombus. Normal compressibility, respiratory phasicity and response to augmentation. Saphenofemoral Junction: No evidence of thrombus. Normal compressibility and flow on color Doppler imaging. Profunda Femoral Vein: No evidence of thrombus. Normal compressibility and flow on color Doppler imaging. Femoral Vein: No evidence of thrombus. Normal compressibility, respiratory phasicity and response to augmentation. Popliteal Vein: No evidence  of thrombus. Normal compressibility, respiratory phasicity and response to augmentation. Calf Veins: No evidence of thrombus. Normal compressibility and flow on color Doppler imaging. Superficial Great Saphenous Vein: No evidence of thrombus. Normal compressibility. Venous Reflux:  None. Other Findings:  None. IMPRESSION: No evidence of deep venous thrombosis. Electronically Signed   By: Rise Mu M.D.   On: 06/28/2019 00:27    ____________________________________________   PROCEDURES  Procedure(s) performed (including Critical Care):  Procedures   ____________________________________________   INITIAL IMPRESSION / ASSESSMENT AND PLAN / ED COURSE  Mark Allison was evaluated in Emergency Department on 06/27/2019 for the symptoms described in the history of present illness. He was evaluated in the context of the global COVID-19 pandemic, which necessitated consideration that the patient might be at risk for infection with the SARS-CoV-2 virus that causes COVID-19. Institutional protocols and algorithms that pertain to the evaluation of patients at risk for COVID-19 are in a state of rapid change based on information released by regulatory bodies including the CDC and federal and state organizations. These policies and algorithms were followed during the patient's care in the ED.    This looks most consistent with cellulitis.  No fluctuation to suggest abscess.  No pain out of portion or crepitus to suggest necrotizing  fasciitis.  Good distal pulse unlikely arterial occlusion.  Given the significant swelling will get ultrasound make sure no DVT.  We will give a dose of ceftriaxone and sent patient on antibiotics given he is well-appearing does not have any signs of sepsis or bacteremia  Given the concern that this was done with some dirty needles will cover with both Keflex and Bactrim to cover for possibility of MRSA.  Patient will follow up with his primary care doctor in  2 to 3 days for wound recheck.  We also discussed that if his symptoms are getting worse that he should return to the ER for IV antibiotics  Instructed patient he will need to follow-up with his primary care doctor if he is worried about contamination such as hepatitis, HIV from the needles.       ____________________________________________   FINAL CLINICAL IMPRESSION(S) / ED DIAGNOSES   Final diagnoses:  Cellulitis of right lower extremity      MEDICATIONS GIVEN DURING THIS VISIT:  Medications  Tdap (BOOSTRIX) injection 0.5 mL (has no administration in time range)  cefTRIAXone (ROCEPHIN) 1 g in sodium chloride 0.9 % 100 mL IVPB (0 g Intravenous Stopped 06/28/19 0007)     ED Discharge Orders         Ordered    cephALEXin (KEFLEX) 500 MG capsule  4 times daily     06/28/19 0041    sulfamethoxazole-trimethoprim (BACTRIM DS) 800-160 MG tablet  2 times daily     06/28/19 0041           Note:  This document was prepared using Dragon voice recognition software and may include unintentional dictation errors.   Vanessa Bellefonte, MD 06/28/19 [phone]

## 2019-06-28 MED ORDER — SULFAMETHOXAZOLE-TRIMETHOPRIM 800-160 MG PO TABS: 1.0000 | ORAL_TABLET | Freq: Two times a day (BID) | ORAL | 0 refills | Status: AC

## 2019-06-28 MED ORDER — CEPHALEXIN 500 MG PO CAPS: 500.0000 mg | ORAL_CAPSULE | Freq: Four times a day (QID) | ORAL | 0 refills | Status: AC

## 2019-06-28 MED ORDER — TETANUS-DIPHTH-ACELL PERTUSSIS 5-2.5-18.5 LF-MCG/0.5 IM SUSP
0.5000 mL | Freq: Once | INTRAMUSCULAR | Status: AC
  Administered 2019-06-28: 01:00:00 0.5 mL via INTRAMUSCULAR
  Filled 2019-06-28: qty 0.5

## 2019-06-28 NOTE — Discharge Instructions (Signed)
We are covering you with different antibiotics in order to cover different bugs that could have caused this.  If you are concerned about the stability of this establishment where you had your tattoo done you should also talk to your primary care doctor about potential HIV and hepatitis testing in the future.  This test will not be positive right away and will still take some time.  So please further discuss with your primary doctor.  You should have your wound rechecked in 2 to 3 days.  Return to the ER if your symptoms are worsening

## 2019-07-04 ENCOUNTER — Emergency Department
Admission: EM | Admit: 2019-07-04 | Discharge: 2019-07-04 | Disposition: A | Discharge status: Home / Self Care | Payer: Self-pay | Attending: Emergency Medicine | Admitting: Emergency Medicine

## 2019-07-04 ENCOUNTER — Encounter: Payer: Self-pay | Admitting: Emergency Medicine

## 2019-07-04 ENCOUNTER — Other Ambulatory Visit: Payer: Self-pay

## 2019-07-04 DIAGNOSIS — F17210 Nicotine dependence, cigarettes, uncomplicated: Secondary | ICD-10-CM | POA: Insufficient documentation

## 2019-07-04 DIAGNOSIS — Z79899 Other long term (current) drug therapy: Secondary | ICD-10-CM | POA: Insufficient documentation

## 2019-07-04 DIAGNOSIS — T7840XA Allergy, unspecified, initial encounter: Secondary | ICD-10-CM | POA: Insufficient documentation

## 2019-07-04 MED ORDER — METHYLPREDNISOLONE 4 MG PO TBPK: ORAL_TABLET | ORAL | 0 refills | Status: DC

## 2019-07-04 MED ORDER — HYDROXYZINE HCL 50 MG PO TABS
50.0000 mg | ORAL_TABLET | Freq: Once | ORAL | Status: AC
  Administered 2019-07-04: 50 mg via ORAL
  Filled 2019-07-04: qty 1

## 2019-07-04 MED ORDER — HYDROXYZINE HCL 50 MG PO TABS: 50.0000 mg | ORAL_TABLET | Freq: Three times a day (TID) | ORAL | 0 refills | Status: DC | PRN

## 2019-07-04 MED ORDER — METHYLPREDNISOLONE SODIUM SUCC 125 MG IJ SOLR
125.0000 mg | Freq: Once | INTRAMUSCULAR | Status: AC
  Administered 2019-07-04: 125 mg via INTRAMUSCULAR
  Filled 2019-07-04: qty 2

## 2019-07-04 NOTE — ED Triage Notes (Addendum)
Pt presents to ED with rash to his itchy red rash to his abd and his neck. Pt states he is taking antibiotics for cellulitis to his right lower leg since Thursday. Leg is improving. Denies any other symptoms.

## 2019-07-04 NOTE — Discharge Instructions (Addendum)
Follow discharge care instruction take medication as directed. °

## 2019-07-04 NOTE — ED Provider Notes (Signed)
[facility] Emergency Department Provider Note   ____________________________________________   First MD Initiated Contact with Patient 07/04/19 [phone]     (approximate)  I have reviewed the triage vital signs and the nursing notes.   HISTORY  Chief Complaint Rash    HPI [place] Tayon Parekh is a 29 y.o. male patient presents with diffuse rash associated with intense itching.  Patient denies angioedema/anaphylactic signs or symptoms.  Patient is currently taking clindamycin and Bactrim DS for cellulitis of the right lower leg.  Patient state has never taken either antibiotics before.  No previous history of medication allergies.         History reviewed. No pertinent past medical history.  There are no problems to display for this patient.   Past Surgical History:  Procedure Laterality Date  . APPENDECTOMY    . WISDOM TOOTH EXTRACTION      Prior to Admission medications   Medication Sig Start Date End Date Taking? Authorizing Provider  cephALEXin (KEFLEX) 500 MG capsule Take 1 capsule (500 mg total) by mouth 4 (four) times daily for 7 days. 06/28/19 07/05/19  Vanessa [place], MD  hydrOXYzine (ATARAX/VISTARIL) 50 MG tablet Take 1 tablet (50 mg total) by mouth 3 (three) times daily as needed. 07/04/19   Sable Feil, PA-C  methylPREDNISolone (MEDROL DOSEPAK) 4 MG TBPK tablet Take Tapered dose as directed starting Friday morning 07/04/19   Sable Feil, PA-C  sulfamethoxazole-trimethoprim (BACTRIM DS) 800-160 MG tablet Take 1 tablet by mouth 2 (two) times daily for 7 days. 06/28/19 07/05/19  Vanessa [place], MD    Allergies Sulfa antibiotics  History reviewed. No pertinent family history.  Social History Social History   Tobacco Use  . Smoking status: Current Some Day Smoker  . Smokeless tobacco: Never Used  Substance Use Topics  . Alcohol use: Yes  . Drug use: Yes    Types: Marijuana    Review of Systems Constitutional: No  fever/chills Eyes: No visual changes. ENT: No sore throat. Cardiovascular: Denies chest pain. Respiratory: Denies shortness of breath. Gastrointestinal: No abdominal pain.  No nausea, no vomiting.  No diarrhea.  No constipation. Genitourinary: Negative for dysuria. Musculoskeletal: Negative for back pain. Skin: Positive for rash. Neurological: Negative for headaches, focal weakness or numbness.   ____________________________________________   PHYSICAL EXAM:  VITAL SIGNS: ED Triage Vitals  Enc Vitals Group     BP 07/04/19 0255 118/74     Pulse Rate 07/04/19 0255 90     Resp 07/04/19 0255 17     Temp 07/04/19 0255 97.7 F (36.5 C)     Temp Source 07/04/19 0255 Oral     SpO2 07/04/19 0255 96 %     Weight 07/04/19 0252 205 lb (93 kg)     Height 07/04/19 0252 6\' 2"  (1.88 m)     Head Circumference --      Peak Flow --      Pain Score 07/04/19 0252 0     Pain Loc --      Pain Edu? --      Excl. in [place]? --    Constitutional: Alert and oriented. Well appearing and in no acute distress. Mouth/Throat: Mucous membranes are moist.  Oropharynx non-erythematous. Neck: No stridor.  Hematological/Lymphatic/Immunilogical: No cervical lymphadenopathy. Cardiovascular: Normal rate, regular rhythm. Grossly normal heart sounds.  Good peripheral circulation. Respiratory: Normal respiratory effort.  No retractions. Lungs CTAB. Neurologic:  Normal speech and language. No gross focal neurologic deficits are appreciated. No  gait instability. Skin:  Skin is warm, dry and intact.  Diffuse macular lesions with signs of excoriation on the abdomen and upper extremities. Psychiatric: Mood and affect are normal. Speech and behavior are normal.  ____________________________________________   LABS (all labs ordered are listed, but only abnormal results are displayed)  Labs Reviewed - No data to  display ____________________________________________  EKG   ____________________________________________  RADIOLOGY  ED MD interpretation:    Official radiology report(s): No results found.  ____________________________________________   PROCEDURES  Procedure(s) performed (including Critical Care):  Procedures   ____________________________________________   INITIAL IMPRESSION / ASSESSMENT AND PLAN / ED COURSE  As part of my medical decision making, I reviewed the following data within the electronic MEDICAL RECORD NUMBER     Patient presents for fresh status post taking clindamycin and sulfur antibiotics.  Patient physical exam consistent with drug rash.  Patient has 1 tablet of Bactrim DS left before completing prescription.  Advised to discontinue Bactrim DS and continue last day of clindamycin.  Patient given a prescription for Medrol Dosepak and Atarax.  Return to ED if condition worsens.    [place] Shivam Mestas was evaluated in Emergency Department on 07/04/2019 for the symptoms described in the history of present illness. He was evaluated in the context of the global COVID-19 pandemic, which necessitated consideration that the patient might be at risk for infection with the SARS-CoV-2 virus that causes COVID-19. Institutional protocols and algorithms that pertain to the evaluation of patients at risk for COVID-19 are in a state of rapid change based on information released by regulatory bodies including the CDC and federal and state organizations. These policies and algorithms were followed during the patient's care in the ED.       ____________________________________________   FINAL CLINICAL IMPRESSION(S) / ED DIAGNOSES  Final diagnoses:  Allergic reaction, initial encounter     ED Discharge Orders         Ordered    methylPREDNISolone (MEDROL DOSEPAK) 4 MG TBPK tablet     07/04/19 0715    hydrOXYzine (ATARAX/VISTARIL) 50 MG tablet  3 times daily PRN      07/04/19 0715           Note:  This document was prepared using Dragon voice recognition software and may include unintentional dictation errors.    Joni Reining, PA-C 07/04/19 0720    Sharman Cheek, MD 07/05/19 [phone]

## 2019-07-04 NOTE — ED Notes (Signed)
See triage note  Presents with rash  States he was placed on Septra and Keflex for cellulitis  States developed rash yesterday  Rash/hives noted across abd

## 2019-10-27 ENCOUNTER — Encounter: Payer: Self-pay | Admitting: Emergency Medicine

## 2019-10-27 ENCOUNTER — Emergency Department
Admission: EM | Admit: 2019-10-27 | Discharge: 2019-10-27 | Disposition: A | Discharge status: Home / Self Care | Payer: Self-pay | Attending: Emergency Medicine | Admitting: Emergency Medicine

## 2019-10-27 ENCOUNTER — Other Ambulatory Visit: Payer: Self-pay

## 2019-10-27 DIAGNOSIS — F17200 Nicotine dependence, unspecified, uncomplicated: Secondary | ICD-10-CM | POA: Insufficient documentation

## 2019-10-27 DIAGNOSIS — Z79899 Other long term (current) drug therapy: Secondary | ICD-10-CM | POA: Insufficient documentation

## 2019-10-27 DIAGNOSIS — L923 Foreign body granuloma of the skin and subcutaneous tissue: Secondary | ICD-10-CM

## 2019-10-27 DIAGNOSIS — T65891A Toxic effect of other specified substances, accidental (unintentional), initial encounter: Secondary | ICD-10-CM | POA: Insufficient documentation

## 2019-10-27 DIAGNOSIS — L03113 Cellulitis of right upper limb: Secondary | ICD-10-CM | POA: Insufficient documentation

## 2019-10-27 LAB — COMPREHENSIVE METABOLIC PANEL
ALT: 30 U/L (ref 0–44)
AST: 25 U/L (ref 15–41)
Albumin: 4.6 g/dL (ref 3.5–5.0)
Alkaline Phosphatase: 43 U/L (ref 38–126)
Anion gap: 6 (ref 5–15)
BUN: 15 mg/dL (ref 6–20)
CO2: 29 mmol/L (ref 22–32)
Calcium: 9.6 mg/dL (ref 8.9–10.3)
Chloride: 102 mmol/L (ref 98–111)
Creatinine, Ser: 0.88 mg/dL (ref 0.61–1.24)
GFR calc Af Amer: >60 mL/min (ref >60)
GFR calc non Af Amer: >60 mL/min (ref >60)
Glucose, Bld: 76 mg/dL (ref 70–99)
Potassium: 4 mmol/L (ref 3.5–5.1)
Sodium: 137 mmol/L (ref 135–145)
Total Bilirubin: 0.7 mg/dL (ref 0.3–1.2)
Total Protein: 7.6 g/dL (ref 6.5–8.1)

## 2019-10-27 LAB — CBC WITH DIFFERENTIAL/PLATELET
Abs Immature Granulocytes: 0.03 10*3/uL (ref 0.00–0.07)
Basophils Absolute: 0 10*3/uL (ref 0.0–0.1)
Basophils Relative: 0 %
Eosinophils Absolute: 0.6 10*3/uL — ABNORMAL HIGH (ref 0.0–0.5)
Eosinophils Relative: 7 %
HCT: 47 % (ref 39.0–52.0)
Hemoglobin: 16.7 g/dL (ref 13.0–17.0)
Immature Granulocytes: 0 %
Lymphocytes Relative: 29 %
Lymphs Abs: 2.6 10*3/uL (ref 0.7–4.0)
MCH: 31.5 pg (ref 26.0–34.0)
MCHC: 35.5 g/dL (ref 30.0–36.0)
MCV: 88.5 fL (ref 80.0–100.0)
Monocytes Absolute: 0.6 10*3/uL (ref 0.1–1.0)
Monocytes Relative: 7 %
Neutro Abs: 5.1 10*3/uL (ref 1.7–7.7)
Neutrophils Relative %: 57 %
Platelets: 190 10*3/uL (ref 150–400)
RBC: 5.31 MIL/uL (ref 4.22–5.81)
RDW: 12 % (ref 11.5–15.5)
WBC: 8.9 10*3/uL (ref 4.0–10.5)
nRBC: 0 % (ref 0.0–0.2)

## 2019-10-27 LAB — LACTIC ACID, PLASMA: Lactic Acid, Venous: 1 mmol/L (ref 0.5–1.9)

## 2019-10-27 MED ORDER — CEPHALEXIN 500 MG PO CAPS
500.0000 mg | ORAL_CAPSULE | Freq: Once | ORAL | Status: AC
  Administered 2019-10-27: 500 mg via ORAL
  Filled 2019-10-27: qty 1

## 2019-10-27 MED ORDER — DOXYCYCLINE HYCLATE 100 MG PO CAPS: 100.0000 mg | ORAL_CAPSULE | Freq: Two times a day (BID) | ORAL | 0 refills | Status: AC

## 2019-10-27 MED ORDER — CEPHALEXIN 500 MG PO CAPS: 500.0000 mg | ORAL_CAPSULE | Freq: Four times a day (QID) | ORAL | 0 refills | Status: AC

## 2019-10-27 MED ORDER — OXYCODONE-ACETAMINOPHEN 5-325 MG PO TABS
1.0000 | ORAL_TABLET | Freq: Once | ORAL | Status: AC
  Administered 2019-10-27: 1 via ORAL
  Filled 2019-10-27: qty 1

## 2019-10-27 MED ORDER — DOXYCYCLINE HYCLATE 100 MG PO TABS
100.0000 mg | ORAL_TABLET | Freq: Once | ORAL | Status: AC
  Administered 2019-10-27: 100 mg via ORAL
  Filled 2019-10-27: qty 1

## 2019-10-27 NOTE — ED Triage Notes (Signed)
Pt arrived via POV with reports of possible infected tattoo, pt states he had a tattoo done to right forearm 2 days ago, states he started putting A&D ointment on it today, c/o draining and redness and feels like it has a fever.

## 2019-10-27 NOTE — ED Provider Notes (Signed)
[facility] Emergency Department Provider Note   ____________________________________________   First MD Initiated Contact with Patient 10/27/19 1019     (approximate)  I have reviewed the triage vital signs and the nursing notes.   HISTORY  Chief Complaint Wound Check    HPI Mark Allison is a 29 y.o. male with no significant past medical history who presents to the ED complaining of right arm pain and swelling.  Patient reports that he received a tattoo to his right forearm 3 days ago and started noticing redness and swelling to his arm yesterday.  Redness and swelling has progressed since then, reaching his wrist and elbow.  Area has been tender to touch and oozing clear yellow fluid.  He denies any fevers, chest pain, cough, nausea, or vomiting.  He has had multiple tattoos in the past and never had a similar reaction.        History reviewed. No pertinent past medical history.  There are no problems to display for this patient.   Past Surgical History:  Procedure Laterality Date  . APPENDECTOMY    . WISDOM TOOTH EXTRACTION      Prior to Admission medications   Medication Sig Start Date End Date Taking? Authorizing Provider  cephALEXin (KEFLEX) 500 MG capsule Take 1 capsule (500 mg total) by mouth 4 (four) times daily for 7 days. 10/27/19 11/03/19  Chesley Noon, MD  doxycycline (VIBRAMYCIN) 100 MG capsule Take 1 capsule (100 mg total) by mouth 2 (two) times daily for 7 days. 10/27/19 11/03/19  Chesley Noon, MD  hydrOXYzine (ATARAX/VISTARIL) 50 MG tablet Take 1 tablet (50 mg total) by mouth 3 (three) times daily as needed. 07/04/19   Joni Reining, PA-C  methylPREDNISolone (MEDROL DOSEPAK) 4 MG TBPK tablet Take Tapered dose as directed starting Friday morning 07/04/19   Joni Reining, PA-C    Allergies Sulfa antibiotics  History reviewed. No pertinent family history.  Social History Social History   Tobacco Use  . Smoking  status: Current Some Day Smoker  . Smokeless tobacco: Never Used  Vaping Use  . Vaping Use: Never used  Substance Use Topics  . Alcohol use: Yes  . Drug use: Yes    Types: Marijuana    Review of Systems  Constitutional: No fever/chills Eyes: No visual changes. ENT: No sore throat. Cardiovascular: Denies chest pain. Respiratory: Denies shortness of breath. Gastrointestinal: No abdominal pain.  No nausea, no vomiting.  No diarrhea.  No constipation. Genitourinary: Negative for dysuria. Musculoskeletal: Negative for back pain.  Positive for right arm pain and swelling. Skin: Positive for rash. Neurological: Negative for headaches, focal weakness or numbness.  ____________________________________________   PHYSICAL EXAM:  VITAL SIGNS: ED Triage Vitals  Enc Vitals Group     BP 10/27/19 0127 136/74     Pulse Rate 10/27/19 0127 82     Resp 10/27/19 0127 18     Temp 10/27/19 0127 98.9 F (37.2 C)     Temp Source 10/27/19 0127 Oral     SpO2 10/27/19 0127 97 %     Weight 10/27/19 0144 190 lb (86.2 kg)     Height 10/27/19 0144 6\' 2"  (1.88 m)     Head Circumference --      Peak Flow --      Pain Score 10/27/19 0144 10     Pain Loc --      Pain Edu? --      Excl. in [place]? --  Constitutional: Alert and oriented. Eyes: Conjunctivae are normal. Head: Atraumatic. Nose: No congestion/rhinnorhea. Mouth/Throat: Mucous membranes are moist. Neck: Normal ROM Cardiovascular: Normal rate, regular rhythm. Grossly normal heart sounds.  2+ radial pulses bilaterally. Respiratory: Normal respiratory effort.  No retractions. Lungs CTAB. Gastrointestinal: Soft and nontender. No distention. Genitourinary: deferred Musculoskeletal: No lower extremity tenderness nor edema.  Erythema, edema, and warmth affecting entire right forearm and tracking just proximal to his elbow and distal to his wrist on the right.  Compartments are soft with mild tenderness to palpation.  Serous drainage noted with  no focal fluctuance. Neurologic:  Normal speech and language. No gross focal neurologic deficits are appreciated. Skin:  Skin is warm, dry and intact. No rash noted. Psychiatric: Mood and affect are normal. Speech and behavior are normal.  ____________________________________________   LABS (all labs ordered are listed, but only abnormal results are displayed)  Labs Reviewed  CBC WITH DIFFERENTIAL/PLATELET - Abnormal; Notable for the following components:      Result Value   Eosinophils Absolute 0.6 (*)    All other components within normal limits  LACTIC ACID, PLASMA  COMPREHENSIVE METABOLIC PANEL    PROCEDURES  Procedure(s) performed (including Critical Care):  Procedures   ____________________________________________   INITIAL IMPRESSION / ASSESSMENT AND PLAN / ED COURSE       29 year old male with no significant past medical history presents to the ED complaining of pain and swelling affecting the entirety of his right forearm since receiving a tattoo 3 days ago.  He appears well and vital signs are reassuring, work-up not consistent with sepsis.  He does appear to have a cellulitis affecting almost the entirety of his right forearm, no limitation in range of motion of his joints to suggest septic arthritis and no evidence of compartment syndrome.  We will treat with Keflex as well as doxycycline to cover for MRSA as patient has an allergy to sulfa antibiotics.  Dressing was placed and he was counseled to follow-up with his PCP, otherwise return to the ED for new or worsening symptoms.  Patient agrees with plan.      ____________________________________________   FINAL CLINICAL IMPRESSION(S) / ED DIAGNOSES  Final diagnoses:  Cellulitis of right upper extremity  Tattoo reaction     ED Discharge Orders         Ordered    cephALEXin (KEFLEX) 500 MG capsule  4 times daily        10/27/19 1047    doxycycline (VIBRAMYCIN) 100 MG capsule  2 times daily         10/27/19 1047           Note:  This document was prepared using Dragon voice recognition software and may include unintentional dictation errors.   Chesley Noon, MD 10/27/19 [phone]

## 2019-10-27 NOTE — ED Notes (Signed)
AAOx3.  Skin warm and dry.  NAD 

## 2019-10-27 NOTE — Discharge Instructions (Signed)
Please schedule follow-up with your PCP within the next week for reassessment of the infection to your arm.  If redness, pain, or swelling tracks further into your hand or up your arm despite taking antibiotics, please return to the ER for reevaluation.  Please also return to the ER if you develop fevers, malaise, vomiting, or any other concerning symptoms.

## 2019-10-27 NOTE — ED Notes (Signed)
Dressing to right forearm changed due to being soiled from weeping tatoo. No distress noted, cont to monitor

## 2019-12-18 ENCOUNTER — Ambulatory Visit: Payer: Self-pay | Admitting: Family Medicine

## 2019-12-18 ENCOUNTER — Other Ambulatory Visit: Payer: Self-pay

## 2019-12-18 ENCOUNTER — Encounter: Payer: Self-pay | Admitting: Family Medicine

## 2019-12-18 DIAGNOSIS — Z113 Encounter for screening for infections with a predominantly sexual mode of transmission: Secondary | ICD-10-CM

## 2019-12-18 DIAGNOSIS — N341 Nonspecific urethritis: Secondary | ICD-10-CM

## 2019-12-18 LAB — GRAM STAIN

## 2019-12-18 MED ORDER — DOXYCYCLINE HYCLATE 100 MG PO TABS: 100.0000 mg | ORAL_TABLET | Freq: Two times a day (BID) | ORAL | 0 refills | Status: DC

## 2019-12-18 NOTE — Addendum Note (Signed)
Addended by: Lyman Speller on: 12/18/2019 12:51 PM   Modules accepted: Orders

## 2019-12-18 NOTE — Progress Notes (Signed)
Pt is here for STD screening. 

## 2019-12-18 NOTE — Progress Notes (Signed)
Gram stain reviewed with provider and is negative today. Pt received Doxycycline 100mg  #14, 1 tab po BID x7 days per , FNP verbal order. Counseled pt per provider orders and pt states understanding. Provider orders completed.

## 2019-12-18 NOTE — Progress Notes (Signed)
   [facility] Department STI clinic/screening visit  Subjective:  Mark Allison is a 29 y.o. male being seen today for an STI screening visit. The patient reports they do have symptoms.    Patient has the following medical conditions:  There are no problems to display for this patient.    Chief Complaint  Patient presents with  . SEXUALLY TRANSMITTED DISEASE    screening    HPI  Patient reports that he has had increased burning with urination x 1 day.  States he always wears condoms but on 10/24 condoms broke.  Denies other symptoms.   See flowsheet for further details and programmatic requirements.    The following portions of the patient's history were reviewed and updated as appropriate: allergies, current medications, past medical history, past social history, past surgical history and problem list.  Objective:  There were no vitals filed for this visit.  Physical Exam Constitutional:      Appearance: Normal appearance.  HENT:     Head: Normocephalic and atraumatic.     Comments: No nits or hair loss    Mouth/Throat:     Mouth: Mucous membranes are moist.     Pharynx: Oropharynx is clear. No oropharyngeal exudate or posterior oropharyngeal erythema.  Pulmonary:     Effort: Pulmonary effort is normal.  Abdominal:     General: Abdomen is flat.     Palpations: Abdomen is soft. There is no hepatomegaly or mass.     Tenderness: There is no abdominal tenderness.  Genitourinary:    Pubic Area: No rash or pubic lice.      Penis: Normal.      Testes: Normal.     Epididymis:     Right: Normal.     Left: Normal.     Comments: Small amount of discharge noted on slide Lymphadenopathy:     Head:     Right side of head: No preauricular or posterior auricular adenopathy.     Left side of head: No preauricular or posterior auricular adenopathy.     Cervical: No cervical adenopathy.     Upper Body:     Right upper body: No supraclavicular or  axillary adenopathy.     Left upper body: No supraclavicular or axillary adenopathy.     Lower Body: No right inguinal adenopathy. No left inguinal adenopathy.  Skin:    General: Skin is warm and dry.     Findings: No rash.  Neurological:     Mental Status: He is alert and oriented to person, place, and time.       Assessment and Plan:  Mark Jermaine Marlin is a 29 y.o. male presenting to the [facility] Department for STI screening   1. Screening examination for venereal disease  - Gram stain - Gonococcus culture  2. Nongonococcal urethritis Treat based on symptoms and exam. - doxycycline (VIBRA-TABS) 100 MG tablet; Take 1 tablet (100 mg total) by mouth 2 (two) times daily.  Dispense: 20 tablet; Refill: 0 Co to abstain from sex 1 week and always use condoms for STD prevention. Co to notify partner for treatment.  No follow-ups on file.  No future appointments.  Larene Pickett, FNP

## 2019-12-23 LAB — GONOCOCCUS CULTURE

## 2020-08-28 ENCOUNTER — Other Ambulatory Visit: Payer: Self-pay

## 2020-08-28 ENCOUNTER — Emergency Department
Admission: EM | Admit: 2020-08-28 | Discharge: 2020-08-28 | Disposition: A | Discharge status: Home / Self Care | Payer: Self-pay | Attending: Emergency Medicine | Admitting: Emergency Medicine

## 2020-08-28 ENCOUNTER — Emergency Department: Payer: Self-pay

## 2020-08-28 ENCOUNTER — Encounter: Payer: Self-pay | Admitting: Emergency Medicine

## 2020-08-28 DIAGNOSIS — Z87891 Personal history of nicotine dependence: Secondary | ICD-10-CM | POA: Insufficient documentation

## 2020-08-28 DIAGNOSIS — T671XXA Heat syncope, initial encounter: Secondary | ICD-10-CM | POA: Insufficient documentation

## 2020-08-28 LAB — CBC WITH DIFFERENTIAL/PLATELET
Abs Immature Granulocytes: 0.01 10*3/uL (ref 0.00–0.07)
Basophils Absolute: 0 10*3/uL (ref 0.0–0.1)
Basophils Relative: 1 %
Eosinophils Absolute: 0.2 10*3/uL (ref 0.0–0.5)
Eosinophils Relative: 4 %
HCT: 48.6 % (ref 39.0–52.0)
Hemoglobin: 17.3 g/dL — ABNORMAL HIGH (ref 13.0–17.0)
Immature Granulocytes: 0 %
Lymphocytes Relative: 58 %
Lymphs Abs: 2.3 10*3/uL (ref 0.7–4.0)
MCH: 31.9 pg (ref 26.0–34.0)
MCHC: 35.6 g/dL (ref 30.0–36.0)
MCV: 89.7 fL (ref 80.0–100.0)
Monocytes Absolute: 0.3 10*3/uL (ref 0.1–1.0)
Monocytes Relative: 7 %
Neutro Abs: 1.2 10*3/uL — ABNORMAL LOW (ref 1.7–7.7)
Neutrophils Relative %: 30 %
Platelets: 190 10*3/uL (ref 150–400)
RBC: 5.42 MIL/uL (ref 4.22–5.81)
RDW: 11.9 % (ref 11.5–15.5)
Smear Review: NORMAL
WBC: 4 10*3/uL (ref 4.0–10.5)
nRBC: 0 % (ref 0.0–0.2)

## 2020-08-28 LAB — BASIC METABOLIC PANEL
Anion gap: 7 (ref 5–15)
BUN: 13 mg/dL (ref 6–20)
CO2: 27 mmol/L (ref 22–32)
Calcium: 9.3 mg/dL (ref 8.9–10.3)
Chloride: 105 mmol/L (ref 98–111)
Creatinine, Ser: 0.84 mg/dL (ref 0.61–1.24)
GFR, Estimated: >60 mL/min (ref >60)
Glucose, Bld: 107 mg/dL — ABNORMAL HIGH (ref 70–99)
Potassium: 4.1 mmol/L (ref 3.5–5.1)
Sodium: 139 mmol/L (ref 135–145)

## 2020-08-28 LAB — CBC
HCT: 51.8 % (ref 39.0–52.0)
Hemoglobin: 18.4 g/dL — ABNORMAL HIGH (ref 13.0–17.0)
MCH: 31.3 pg (ref 26.0–34.0)
MCHC: 35.5 g/dL (ref 30.0–36.0)
MCV: 88.2 fL (ref 80.0–100.0)
Platelets: 191 10*3/uL (ref 150–400)
RBC: 5.87 MIL/uL — ABNORMAL HIGH (ref 4.22–5.81)
RDW: 11.9 % (ref 11.5–15.5)
WBC: 3.9 10*3/uL — ABNORMAL LOW (ref 4.0–10.5)
nRBC: 0 % (ref 0.0–0.2)

## 2020-08-28 LAB — HEPATIC FUNCTION PANEL
ALT: 21 U/L (ref 0–44)
AST: 24 U/L (ref 15–41)
Albumin: 4.2 g/dL (ref 3.5–5.0)
Alkaline Phosphatase: 46 U/L (ref 38–126)
Bilirubin, Direct: <0.1 mg/dL (ref 0.0–0.2)
Total Bilirubin: 0.4 mg/dL (ref 0.3–1.2)
Total Protein: 7.8 g/dL (ref 6.5–8.1)

## 2020-08-28 LAB — URINALYSIS, COMPLETE (UACMP) WITH MICROSCOPIC
Bacteria, UA: NONE SEEN
Bilirubin Urine: NEGATIVE
Glucose, UA: NEGATIVE mg/dL
Hgb urine dipstick: NEGATIVE
Ketones, ur: NEGATIVE mg/dL
Leukocytes,Ua: NEGATIVE
Nitrite: NEGATIVE
Protein, ur: NEGATIVE mg/dL
Specific Gravity, Urine: 1.025 (ref 1.005–1.030)
Squamous Epithelial / HPF: NONE SEEN (ref 0–5)
pH: 6 (ref 5.0–8.0)

## 2020-08-28 LAB — TROPONIN I (HIGH SENSITIVITY): Troponin I (High Sensitivity): <2 ng/L (ref <18)

## 2020-08-28 MED ORDER — SODIUM CHLORIDE 0.9 % IV BOLUS
1000.0000 mL | Freq: Once | INTRAVENOUS | Status: AC
  Administered 2020-08-28: 1000 mL via INTRAVENOUS

## 2020-08-28 NOTE — ED Notes (Signed)
See triage note  States he has had 2 episodes of "blacking out"  States the first one was Sunday  But had been outside in the heat on Saturday   Next episode was yesterday  Denies any pain  States just feels dizzy

## 2020-08-28 NOTE — Discharge Instructions (Addendum)
Follow-up with neurology.  Please call for an appointment Drink plenty of fluids as I feel this is mostly related to the heat Return emergency department if you have another episode.  Please return immediately after the episode.

## 2020-08-28 NOTE — ED Triage Notes (Signed)
C/O two syncopal events, one on Sunday and one yesterday.  AAOx3.  Skin warm and dry. NAD.  Ambulates with easy and steady gait.

## 2020-08-28 NOTE — ED Provider Notes (Signed)
[facility] Emergency Department Provider Note  ____________________________________________   Event Date/Time   First MD Initiated Contact with Patient 08/28/20 1306     (approximate)  I have reviewed the triage vital signs and the nursing notes.   HISTORY  Chief Complaint Loss of Consciousness    HPI Mark Allison is a 30 y.o. male presents emergency department complaining of 2 separate episodes of syncope.  Patient states that the second time he passed out that his cousin told him he was shaking all over.  States he was only out for a few seconds.  States this was also in the heat.  He states on July 4 when the same thing happened but he had been drinking alcohol in the heat.  States he tried to drink water but those he did not stay hydrated.  He denies chest pain or shortness of breath.  States when he started to pass out he just felt really hot.  He had no pain.  No history of epilepsy or seizures.  History reviewed. No pertinent past medical history.  There are no problems to display for this patient.   Past Surgical History:  Procedure Laterality Date   APPENDECTOMY     WISDOM TOOTH EXTRACTION      Prior to Admission medications   Not on File    Allergies Sulfa antibiotics  No family history on file.  Social History Social History   Tobacco Use   Smoking status: Former    Pack years: 0.00   Smokeless tobacco: Never  Vaping Use   Vaping Use: Never used  Substance Use Topics   Alcohol use: Yes   Drug use: Not Currently    Types: Marijuana    Review of Systems  Constitutional: No fever/chills Eyes: No visual changes. ENT: No sore throat. Respiratory: Denies cough Cardiovascular: Denies chest pain Gastrointestinal: Denies abdominal pain Genitourinary: Negative for dysuria. Musculoskeletal: Negative for back pain. Skin: Negative for rash. Psychiatric: no mood changes,      ____________________________________________   PHYSICAL EXAM:  VITAL SIGNS: ED Triage Vitals  Enc Vitals Group     BP 08/28/20 1147 125/78     Pulse Rate 08/28/20 1147 75     Resp 08/28/20 1147 16     Temp 08/28/20 1147 98.9 F (37.2 C)     Temp Source 08/28/20 1147 Oral     SpO2 08/28/20 1147 100 %     Weight 08/28/20 1140 190 lb 0.6 oz (86.2 kg)     Height 08/28/20 1140 6\' 2"  (1.88 m)     Head Circumference --      Peak Flow --      Pain Score 08/28/20 1140 0     Pain Loc --      Pain Edu? --      Excl. in [place]? --     Constitutional: Alert and oriented. Well appearing and in no acute distress. Eyes: Conjunctivae are normal.  Head: Atraumatic. Nose: No congestion/rhinnorhea. Mouth/Throat: Mucous membranes are moist.   Neck:  supple no lymphadenopathy noted Cardiovascular: Normal rate, regular rhythm. Heart sounds are normal Respiratory: Normal respiratory effort.  No retractions, lungs c t a  Abd: soft nontender bs normal all 4 quad GU: deferred Musculoskeletal: FROM all extremities, warm and well perfused Neurologic:  Normal speech and language.  Cranial nerves II through XII grossly intact Skin:  Skin is warm, dry and intact. No rash noted. Psychiatric: Mood and affect are normal. Speech and behavior  are normal.  ____________________________________________   LABS (all labs ordered are listed, but only abnormal results are displayed)  Labs Reviewed  BASIC METABOLIC PANEL - Abnormal; Notable for the following components:      Result Value   Glucose, Bld 107 (*)    All other components within normal limits  CBC - Abnormal; Notable for the following components:   WBC 3.9 (*)    RBC 5.87 (*)    Hemoglobin 18.4 (*)    All other components within normal limits  URINALYSIS, COMPLETE (UACMP) WITH MICROSCOPIC - Abnormal; Notable for the following components:   Color, Urine YELLOW (*)    APPearance CLEAR (*)    All other components within normal limits  CBC  WITH DIFFERENTIAL/PLATELET - Abnormal; Notable for the following components:   Hemoglobin 17.3 (*)    Neutro Abs 1.2 (*)    All other components within normal limits  HEPATIC FUNCTION PANEL  CBG MONITORING, ED  TROPONIN I (HIGH SENSITIVITY)   ____________________________________________   ____________________________________________  RADIOLOGY  CT of the head  ____________________________________________   PROCEDURES  Procedure(s) performed: No  Procedures    ____________________________________________   INITIAL IMPRESSION / ASSESSMENT AND PLAN / ED COURSE  Pertinent labs & imaging results that were available during my care of the patient were reviewed by me and considered in my medical decision making (see chart for details).   The patient is 30 year old male presents with 2 episodes of syncope in the past 2 weeks.  None today.  See HPI physical exam shows patient per stable and well.  He has been up walking around with no difficulty.  DDx: Heat exhaustion, syncope, seizures, EtOH intoxication, DKA  CBC has elevated hemoglobin of 18.4, metabolic panel shows elevated glucose of 170, troponin is normal, urinalysis is normal no ketones or protein noted,  Ekg shows nsr , see physician read  CT the head reviewed by me confirmed by radiology to be negative  Hgb improved after fluids, pt to continue drinking fluids, f/u with neurology, low suspicion for seizure but do think he should be evaluated, stay out of heat, return immediately if has another episode, discharged in stable condition   Mark Melville Engen was evaluated in Emergency Department on 08/28/2020 for the symptoms described in the history of present illness. He was evaluated in the context of the global COVID-19 pandemic, which necessitated consideration that the patient might be at risk for infection with the SARS-CoV-2 virus that causes COVID-19. Institutional protocols and algorithms that pertain to the  evaluation of patients at risk for COVID-19 are in a state of rapid change based on information released by regulatory bodies including the CDC and federal and state organizations. These policies and algorithms were followed during the patient's care in the ED.    As part of my medical decision making, I reviewed the following data within the electronic MEDICAL RECORD NUMBER History obtained from family, Nursing notes reviewed and incorporated, Labs reviewed , EKG interpreted NSR, Old chart reviewed, Radiograph reviewed , Notes from prior ED visits, and [place] Controlled Substance Database  ____________________________________________   FINAL CLINICAL IMPRESSION(S) / ED DIAGNOSES  Final diagnoses:  Heat syncope, initial encounter      NEW MEDICATIONS STARTED DURING THIS VISIT:  There are no discharge medications for this patient.    Note:  This document was prepared using Dragon voice recognition software and may include unintentional dictation errors.    Faythe Ghee, PA-C 08/28/20 1623    Phineas Semen,  MD 08/28/20 1633

## 2021-12-03 IMAGING — CT CT HEAD W/O CM
3 series · 16 of 47 positions shown, 19 images · non-contrast
Comparison: 08/18/2009

CLINICAL DATA: Dizziness, syncope, question seizure

EXAM:
CT HEAD WITHOUT CONTRAST
TECHNIQUE: Contiguous axial images were obtained from the base of the skull
through the vertex without intravenous contrast. Sagittal and
coronal MPR images reconstructed from axial data set.

[Series 4: head wo · axial · 0.40mm/px · z∈[+286,+411]mm · 10 of 30 slices shown, 13 images]
[im 3/30  brain]
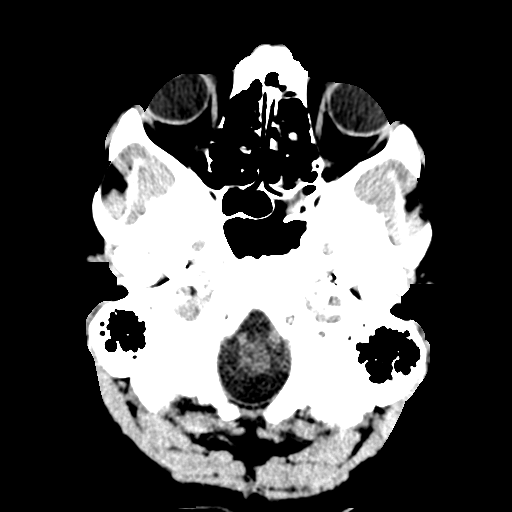
[im 3/30  bone]
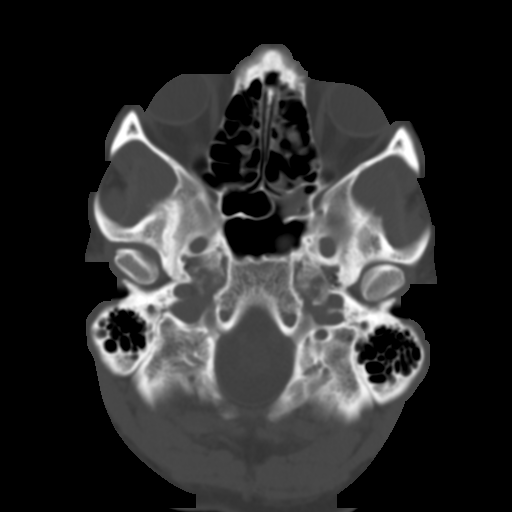
[im 6/30  brain]
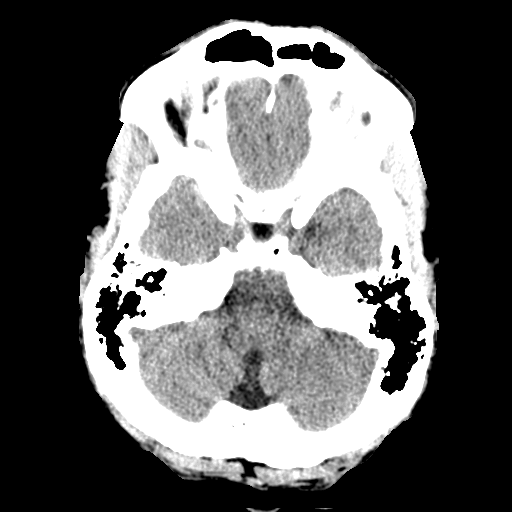
[im 9/30  brain]
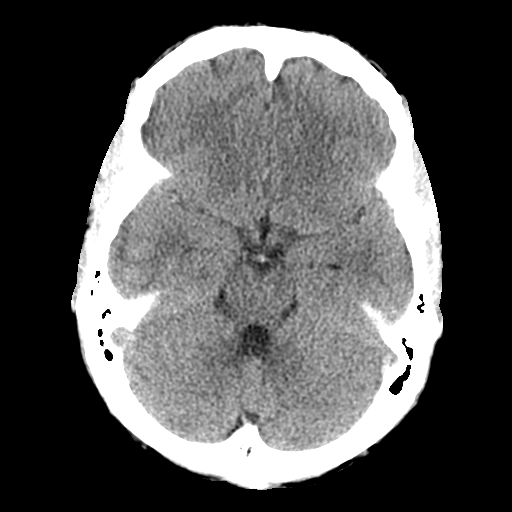
[im 11/30  brain]
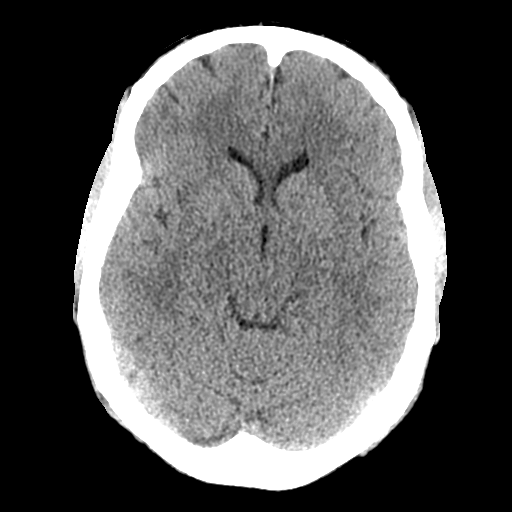
[im 14/30  brain]
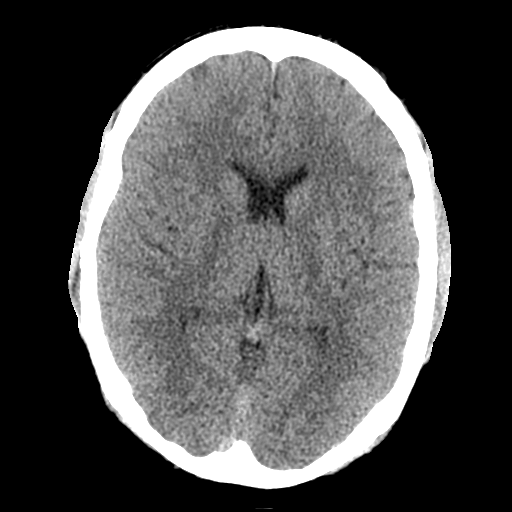
[im 14/30  bone]
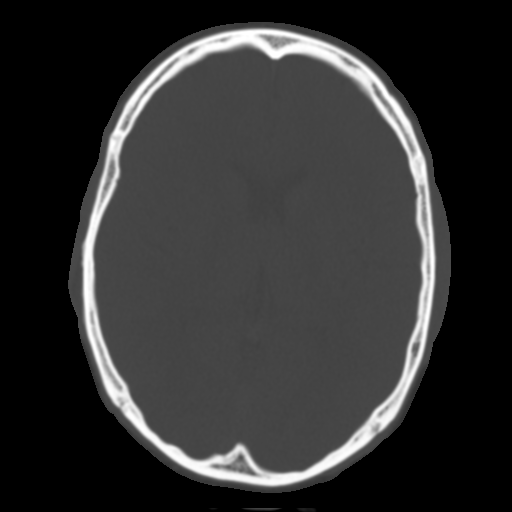
[im 17/30  brain]
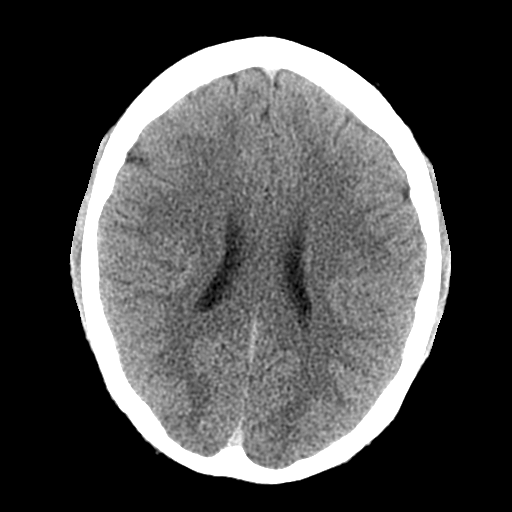
[im 20/30  brain]
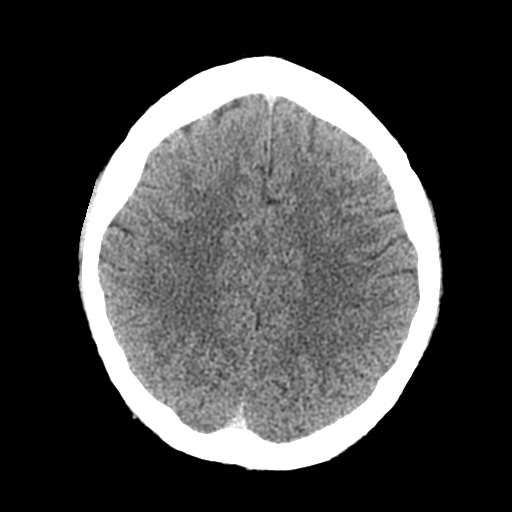
[im 23/30  brain]
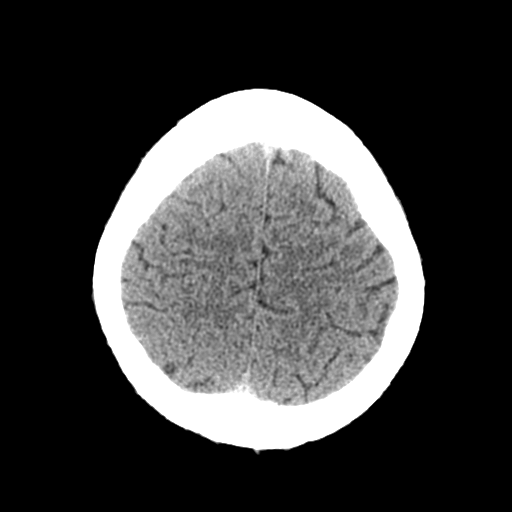
[im 25/30  brain]
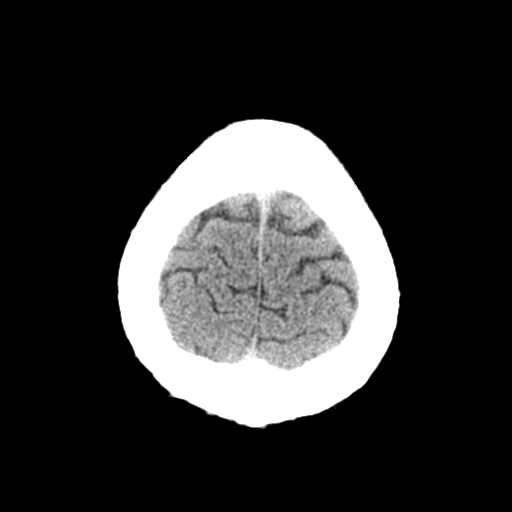
[im 25/30  bone]
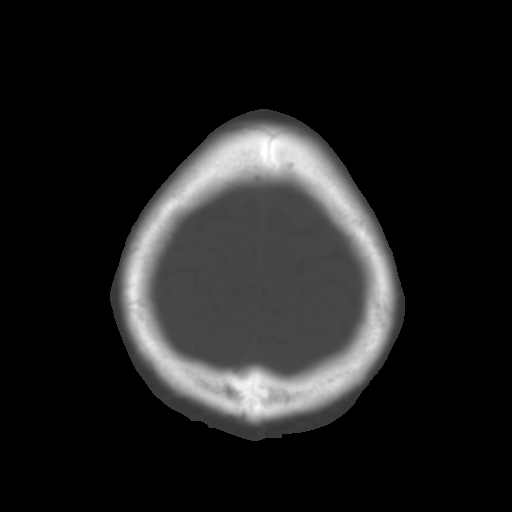
[im 28/30  brain]
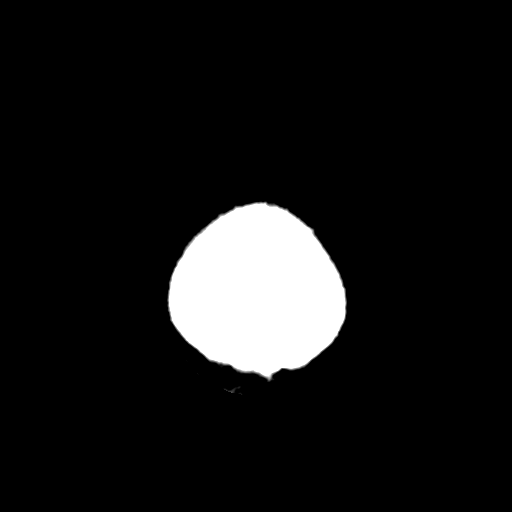

[Series 5: coronal soft tissue · coronal · 0.33mm/px · 3 of 68 slices shown]
[im 23/68  brain]
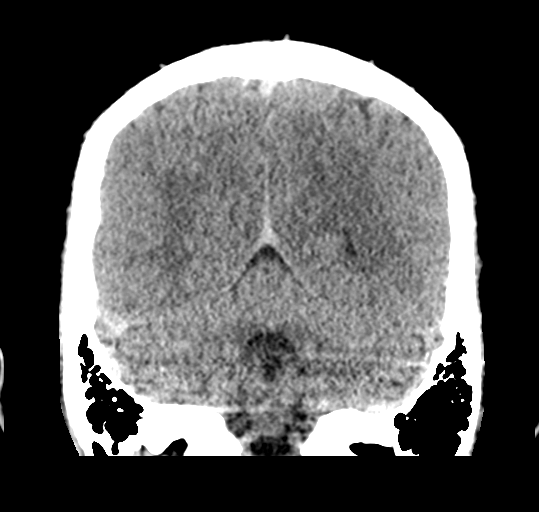
[im 30/68  brain]
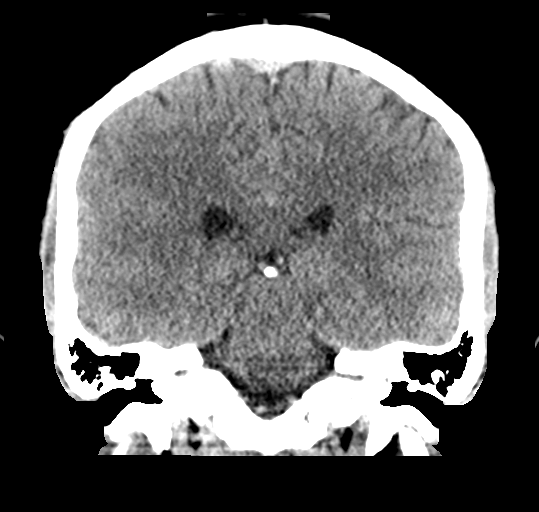
[im 38/68  brain]
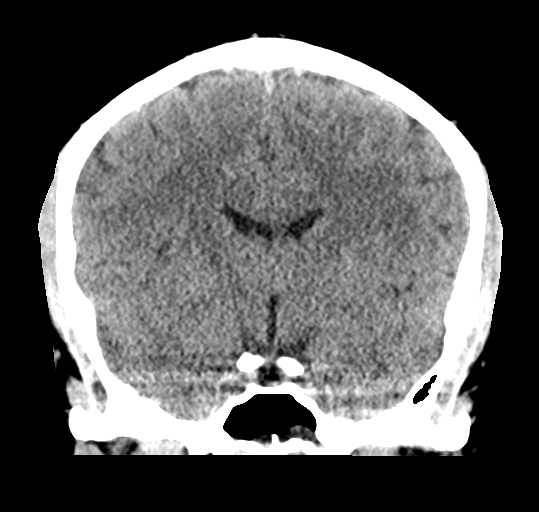

[Series 6: sagittal soft tissue · sagittal · 0.33mm/px · 3 of 57 slices shown]
[im 19/57  brain]
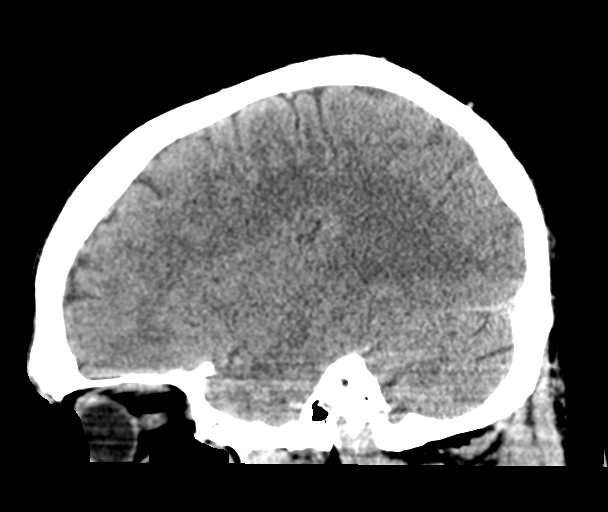
[im 29/57  brain]
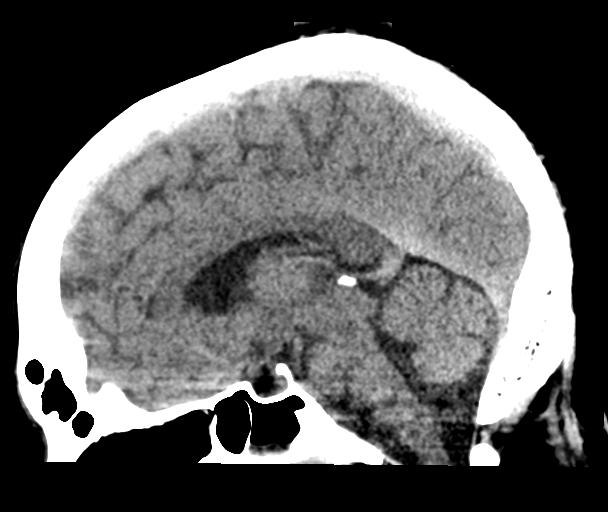
[im 38/57  brain]
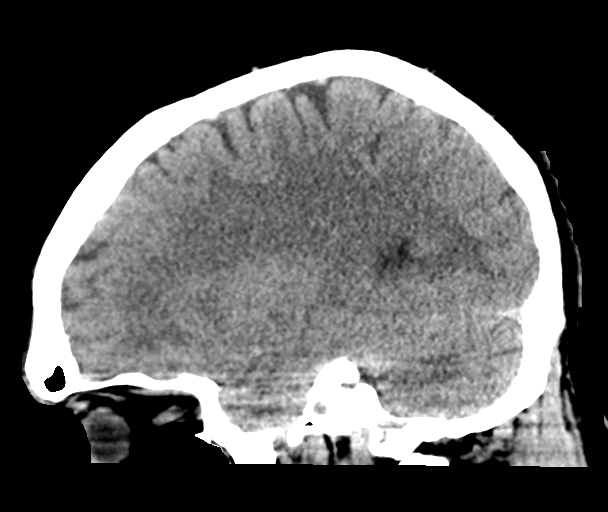

[16 of 47 positions shown; findings below may reference images not displayed]

FINDINGS: Brain: Normal ventricular morphology. No midline shift or mass
effect. Normal appearance of brain parenchyma. No intracranial
hemorrhage, mass lesion, or evidence of acute infarction. No
extra-axial fluid collections.

Vascular: No hyperdense vessels

Skull: Intact

Sinuses/Orbits: Scattered mucosal thickening in sphenoid sinus
frontal sinus, and ethmoid air cells.

Other: N/A
IMPRESSION: No acute intracranial abnormalities.

## 2023-06-20 ENCOUNTER — Emergency Department: Payer: PRIVATE HEALTH INSURANCE

## 2023-06-20 ENCOUNTER — Other Ambulatory Visit: Payer: Self-pay

## 2023-06-20 ENCOUNTER — Emergency Department
Admission: EM | Admit: 2023-06-20 | Discharge: 2023-06-20 | Disposition: A | Discharge status: Home / Self Care | Payer: PRIVATE HEALTH INSURANCE | Attending: Emergency Medicine | Admitting: Emergency Medicine

## 2023-06-20 DIAGNOSIS — X503XXA Overexertion from repetitive movements, initial encounter: Secondary | ICD-10-CM | POA: Diagnosis not present

## 2023-06-20 DIAGNOSIS — S4991XA Unspecified injury of right shoulder and upper arm, initial encounter: Secondary | ICD-10-CM | POA: Diagnosis present

## 2023-06-20 DIAGNOSIS — Y990 Civilian activity done for income or pay: Secondary | ICD-10-CM | POA: Diagnosis not present

## 2023-06-20 DIAGNOSIS — S46011A Strain of muscle(s) and tendon(s) of the rotator cuff of right shoulder, initial encounter: Secondary | ICD-10-CM | POA: Insufficient documentation

## 2023-06-20 MED ORDER — BACLOFEN 10 MG PO TABS: 10.0000 mg | ORAL_TABLET | Freq: Three times a day (TID) | ORAL | 0 refills | Status: AC

## 2023-06-20 MED ORDER — MELOXICAM 15 MG PO TABS: 15.0000 mg | ORAL_TABLET | Freq: Every day | ORAL | 2 refills | Status: DC

## 2023-06-20 NOTE — ED Triage Notes (Signed)
 Pt comes in via pov with complaints of right shoulder pain. Pt states that on Sunday he was lifting his daughter up when he felt the pain in his shoulder. 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 Pt has irritated the shoulder before, but never had it looked at before. This time feels a lot worse. Pt is alert and oriented x4, with no there signs of acute distress at this time.

## 2023-06-20 NOTE — ED Provider Notes (Signed)
 [facility] Provider Note    Event Date/Time   First MD Initiated Contact with Patient 06/20/23 1324     (approximate)   History   Shoulder Pain   HPI  Mark Allison is a 33 y.o. male with no significant past medical history presents emergency department right shoulder pain.  Patient states that it has been bothering him for a while and does heavy lifting at work.  However he was at the park with his daughter when he lifted her up and felt a pop in the shoulder.  Has increased pain with any range of motion.  No numbness or tingling.  No other injuries reported.  Patient is right-handed.      Physical Exam   Triage Vital Signs: ED Triage Vitals  Encounter Vitals Group     BP 06/20/23 1246 131/83     Systolic BP Percentile --      Diastolic BP Percentile --      Pulse Rate 06/20/23 1245 95     Resp 06/20/23 1245 17     Temp 06/20/23 1245 98.1 F (36.7 C)     Temp Source 06/20/23 1245 Oral     SpO2 06/20/23 1245 100 %     Weight 06/20/23 1245 210 lb (95.3 kg)     Height 06/20/23 1245 6\' 2"  (1.88 m)     Head Circumference --      Peak Flow --      Pain Score 06/20/23 1257 6     Pain Loc --      Pain Education --      Exclude from Growth Chart --     Most recent vital signs: Vitals:   06/20/23 1246 06/20/23 1256  BP: 131/83 119/80  Pulse:  84  Resp:  17  Temp:  98.6 F (37 C)  SpO2:  100%     General: Awake, no distress.   CV:  Good peripheral perfusion.  Resp:  Normal effort. Abd:  No distention.   Other:  Right shoulder tender along the rotator cuff, joint space slightly tender, decreased range of motion, positive Hochstein, decreased internal rotation, pain is reproduced also with overhead lift, neurovascular intact   ED Results / Procedures / Treatments   Labs (all labs ordered are listed, but only abnormal results are displayed) Labs Reviewed - No data to display   EKG     RADIOLOGY X-ray of the right  shoulder    PROCEDURES:   Procedures Chief Complaint  Patient presents with   Shoulder Pain      MEDICATIONS ORDERED IN ED: Medications - No data to display   IMPRESSION / MDM / ASSESSMENT AND PLAN / ED COURSE  I reviewed the triage vital signs and the nursing notes.                              Differential diagnosis includes, but is not limited to, rotator cuff injury, dislocation, separation, muscle strain, bursitis, tendinitis  Patient's presentation is most consistent with acute illness / injury with system symptoms.   X-ray of the right shoulder was independently reviewed interpreted by me as being negative for any acute abnormality, confirmed by radiology  On physical exam I feel this is more of a rotator cuff strain.  Placed patient in a sling.  No use of the right arm until evaluated by orthopedics.  Have concerns that he has more pain when  he rolls over and sleeps on the shoulder so feel that he will most likely end up needing an MRI which can be done outpatient.  He is to apply ice.  He was given a prescription for meloxicam and baclofen.  Work note provided.  Patient is in agreement treatment plan.  Discharged stable condition.      FINAL CLINICAL IMPRESSION(S) / ED DIAGNOSES   Final diagnoses:  Rotator cuff strain, right, initial encounter     Rx / DC Orders   ED Discharge Orders          Ordered    meloxicam (MOBIC) 15 MG tablet  Daily        06/20/23 1358    baclofen (LIORESAL) 10 MG tablet  3 times daily        06/20/23 1358             Note:  This document was prepared using Dragon voice recognition software and may include unintentional dictation errors.    Delsie Figures, PA-C 06/20/23 1510    Ruth Cove, MD 06/21/23 [phone]

## 2023-06-20 NOTE — ED Notes (Addendum)
 See triage note  Presents with right shoulder pain  States he was playing with his daughter over the weekend  Lifting her a lot  Developed some soreness to right shoulder  Then he went to work today  Lifted something  Felt a pop

## 2023-06-20 NOTE — Discharge Instructions (Signed)
 He may have strained or torn your rotator cuff.  Follow-up with orthopedics.  Wear the sling when out of bed.  Apply ice to the right shoulder.  Take the medications as prescribed.  Return if worsening

## 2023-09-26 ENCOUNTER — Other Ambulatory Visit: Payer: Self-pay | Admitting: Orthopedic Surgery

## 2023-09-26 DIAGNOSIS — S46011D Strain of muscle(s) and tendon(s) of the rotator cuff of right shoulder, subsequent encounter: Secondary | ICD-10-CM

## 2023-09-26 DIAGNOSIS — S43001D Unspecified subluxation of right shoulder joint, subsequent encounter: Secondary | ICD-10-CM

## 2023-09-26 DIAGNOSIS — M25311 Other instability, right shoulder: Secondary | ICD-10-CM

## 2023-10-03 ENCOUNTER — Other Ambulatory Visit: Payer: Self-pay | Admitting: Orthopedic Surgery

## 2023-10-03 DIAGNOSIS — M25311 Other instability, right shoulder: Secondary | ICD-10-CM

## 2023-10-03 DIAGNOSIS — S43001D Unspecified subluxation of right shoulder joint, subsequent encounter: Secondary | ICD-10-CM

## 2023-10-03 DIAGNOSIS — S46011D Strain of muscle(s) and tendon(s) of the rotator cuff of right shoulder, subsequent encounter: Secondary | ICD-10-CM

## 2023-10-05 ENCOUNTER — Ambulatory Visit
Admission: RE | Admit: 2023-10-05 | Discharge: 2023-10-05 | Disposition: A | Discharge status: Home / Self Care | Payer: PRIVATE HEALTH INSURANCE | Source: Ambulatory Visit | Attending: Orthopedic Surgery | Admitting: Orthopedic Surgery

## 2023-10-05 DIAGNOSIS — S43001D Unspecified subluxation of right shoulder joint, subsequent encounter: Secondary | ICD-10-CM

## 2023-10-05 DIAGNOSIS — S46011D Strain of muscle(s) and tendon(s) of the rotator cuff of right shoulder, subsequent encounter: Secondary | ICD-10-CM

## 2023-10-05 DIAGNOSIS — M67813 Other specified disorders of tendon, right shoulder: Secondary | ICD-10-CM | POA: Insufficient documentation

## 2023-10-05 DIAGNOSIS — M25511 Pain in right shoulder: Secondary | ICD-10-CM | POA: Diagnosis present

## 2023-10-05 DIAGNOSIS — M25311 Other instability, right shoulder: Secondary | ICD-10-CM | POA: Diagnosis not present

## 2023-10-05 MED ORDER — GADOBUTROL 1 MMOL/ML IV SOLN
0.0500 mL | Freq: Once | INTRAVENOUS | Status: AC | PRN
  Administered 2023-10-05: 0.05 mL

## 2023-10-05 MED ORDER — IOHEXOL 180 MG/ML  SOLN
20.0000 mL | Freq: Once | INTRAMUSCULAR | Status: AC | PRN
  Administered 2023-10-05: 15 mL

## 2023-10-05 MED ORDER — LIDOCAINE HCL (PF) 1 % IJ SOLN
10.0000 mL | Freq: Once | INTRAMUSCULAR | Status: AC
  Administered 2023-10-05: 5 mL
  Filled 2023-10-05: qty 10

## 2023-10-20 ENCOUNTER — Other Ambulatory Visit: Payer: Self-pay | Admitting: Orthopedic Surgery

## 2023-10-24 ENCOUNTER — Encounter: Payer: Self-pay | Admitting: Orthopedic Surgery

## 2023-10-27 ENCOUNTER — Ambulatory Visit: Payer: Self-pay | Admitting: Anesthesiology

## 2023-10-27 ENCOUNTER — Ambulatory Visit
Admission: RE | Admit: 2023-10-27 | Discharge: 2023-10-27 | Disposition: A | Discharge status: Home / Self Care | Payer: PRIVATE HEALTH INSURANCE | Attending: Orthopedic Surgery | Admitting: Orthopedic Surgery

## 2023-10-27 ENCOUNTER — Encounter
Admission: RE | Disposition: A | Discharge status: Home / Self Care | Payer: Self-pay | Source: Home / Self Care | Attending: Orthopedic Surgery

## 2023-10-27 ENCOUNTER — Encounter: Payer: Self-pay | Admitting: Orthopedic Surgery

## 2023-10-27 ENCOUNTER — Other Ambulatory Visit: Payer: Self-pay

## 2023-10-27 DIAGNOSIS — S43431A Superior glenoid labrum lesion of right shoulder, initial encounter: Secondary | ICD-10-CM | POA: Diagnosis present

## 2023-10-27 DIAGNOSIS — X509XXA Other and unspecified overexertion or strenuous movements or postures, initial encounter: Secondary | ICD-10-CM | POA: Insufficient documentation

## 2023-10-27 DIAGNOSIS — Z87891 Personal history of nicotine dependence: Secondary | ICD-10-CM | POA: Insufficient documentation

## 2023-10-27 DIAGNOSIS — S43491A Other sprain of right shoulder joint, initial encounter: Secondary | ICD-10-CM | POA: Diagnosis not present

## 2023-10-27 HISTORY — DX: Other specified health status: Z78.9

## 2023-10-27 HISTORY — PX: SHOULDER ARTHROSCOPY WITH LABRAL REPAIR: SHX5691

## 2023-10-27 SURGERY — ARTHROSCOPY, SHOULDER, WITH GLENOID LABRUM REPAIR
Anesthesia: General | Site: Shoulder | Laterality: Right

## 2023-10-27 MED ORDER — PHENYLEPHRINE 80 MCG/ML (10ML) SYRINGE FOR IV PUSH (FOR BLOOD PRESSURE SUPPORT)
PREFILLED_SYRINGE | INTRAVENOUS | Status: DC | PRN
  Administered 2023-10-27: 80 ug via INTRAVENOUS
  Administered 2023-10-27: 160 ug via INTRAVENOUS
  Administered 2023-10-27: 80 ug via INTRAVENOUS

## 2023-10-27 MED ORDER — FENTANYL CITRATE (PF) 100 MCG/2ML IJ SOLN
INTRAMUSCULAR | Status: AC
  Filled 2023-10-27: qty 2

## 2023-10-27 MED ORDER — MIDAZOLAM HCL 2 MG/2ML IJ SOLN
2.0000 mg | Freq: Once | INTRAMUSCULAR | Status: AC
  Administered 2023-10-27: 2 mg via INTRAVENOUS

## 2023-10-27 MED ORDER — OXYCODONE HCL 5 MG PO TABS
ORAL_TABLET | ORAL | Status: AC
  Filled 2023-10-27: qty 2

## 2023-10-27 MED ORDER — BUPIVACAINE LIPOSOME 1.3 % IJ SUSP
INTRAMUSCULAR | Status: AC
Start: 2023-10-27 — End: 2023-10-27
  Filled 2023-10-27: qty 10

## 2023-10-27 MED ORDER — MIDAZOLAM HCL 2 MG/2ML IJ SOLN
INTRAMUSCULAR | Status: AC
  Filled 2023-10-27: qty 2

## 2023-10-27 MED ORDER — SEVOFLURANE IN SOLN
RESPIRATORY_TRACT | Status: AC
  Filled 2023-10-27: qty 250

## 2023-10-27 MED ORDER — DEXAMETHASONE SODIUM PHOSPHATE 4 MG/ML IJ SOLN
INTRAMUSCULAR | Status: DC | PRN
  Administered 2023-10-27: 4 mg via INTRAVENOUS

## 2023-10-27 MED ORDER — OXYCODONE HCL 5 MG PO TABS
10.0000 mg | ORAL_TABLET | Freq: Once | ORAL | Status: AC
  Administered 2023-10-27: 10 mg via ORAL

## 2023-10-27 MED ORDER — LACTATED RINGERS IV SOLN
INTRAVENOUS | Status: DC | PRN
  Administered 2023-10-27: 4 mL

## 2023-10-27 MED ORDER — RINGERS IRRIGATION IR SOLN
Status: DC | PRN
  Administered 2023-10-27 (×2): 12000 mL
  Administered 2023-10-27: 9000 mL

## 2023-10-27 MED ORDER — DEXAMETHASONE SODIUM PHOSPHATE 10 MG/ML IJ SOLN
INTRAMUSCULAR | Status: AC
  Filled 2023-10-27: qty 1

## 2023-10-27 MED ORDER — DEXMEDETOMIDINE HCL IN NACL 80 MCG/20ML IV SOLN
INTRAVENOUS | Status: DC | PRN
  Administered 2023-10-27: 8 ug via INTRAVENOUS
  Administered 2023-10-27: 4 ug via INTRAVENOUS

## 2023-10-27 MED ORDER — GLYCOPYRROLATE 0.2 MG/ML IJ SOLN
INTRAMUSCULAR | Status: DC | PRN
Start: 2023-10-27 — End: 2023-10-27
  Administered 2023-10-27: .2 mg via INTRAVENOUS

## 2023-10-27 MED ORDER — ONDANSETRON 4 MG PO TBDP: 4.0000 mg | ORAL_TABLET | Freq: Three times a day (TID) | ORAL | 0 refills | Status: AC | PRN

## 2023-10-27 MED ORDER — CEFAZOLIN SODIUM-DEXTROSE 2-3 GM-%(50ML) IV SOLR
INTRAVENOUS | Status: AC
  Filled 2023-10-27: qty 50

## 2023-10-27 MED ORDER — BUPIVACAINE LIPOSOME 1.3 % IJ SUSP
INTRAMUSCULAR | Status: DC | PRN
  Administered 2023-10-27: 10 mL

## 2023-10-27 MED ORDER — LIDOCAINE HCL (CARDIAC) PF 100 MG/5ML IV SOSY
PREFILLED_SYRINGE | INTRAVENOUS | Status: DC | PRN
  Administered 2023-10-27: 100 mg via INTRAVENOUS

## 2023-10-27 MED ORDER — BUPIVACAINE LIPOSOME 1.3 % IJ SUSP
INTRAMUSCULAR | Status: AC
  Filled 2023-10-27: qty 20

## 2023-10-27 MED ORDER — ACETAMINOPHEN 500 MG PO TABS: 1000.0000 mg | ORAL_TABLET | Freq: Three times a day (TID) | ORAL | 2 refills | Status: AC

## 2023-10-27 MED ORDER — FENTANYL CITRATE (PF) 100 MCG/2ML IJ SOLN
INTRAMUSCULAR | Status: DC | PRN
  Administered 2023-10-27 (×2): 100 ug via INTRAVENOUS

## 2023-10-27 MED ORDER — FENTANYL CITRATE (PF) 100 MCG/2ML IJ SOLN
100.0000 ug | Freq: Once | INTRAMUSCULAR | Status: AC
  Administered 2023-10-27: 100 ug via INTRAVENOUS

## 2023-10-27 MED ORDER — ONDANSETRON HCL 4 MG/2ML IJ SOLN
INTRAMUSCULAR | Status: DC | PRN
  Administered 2023-10-27: 4 mg via INTRAVENOUS

## 2023-10-27 MED ORDER — LIDOCAINE HCL (PF) 2 % IJ SOLN
INTRAMUSCULAR | Status: AC
  Filled 2023-10-27: qty 2

## 2023-10-27 MED ORDER — PROPOFOL 10 MG/ML IV BOLUS
INTRAVENOUS | Status: DC | PRN
  Administered 2023-10-27: 200 mg via INTRAVENOUS

## 2023-10-27 MED ORDER — ASPIRIN 325 MG PO TBEC: 325.0000 mg | DELAYED_RELEASE_TABLET | Freq: Every day | ORAL | 0 refills | Status: AC

## 2023-10-27 MED ORDER — LACTATED RINGERS IV SOLN: INTRAVENOUS | Status: DC

## 2023-10-27 MED ORDER — CEFAZOLIN SODIUM-DEXTROSE 2-4 GM/100ML-% IV SOLN
2.0000 g | INTRAVENOUS | Status: AC
  Administered 2023-10-27: 2 g via INTRAVENOUS

## 2023-10-27 MED ORDER — DEXMEDETOMIDINE HCL IN NACL 80 MCG/20ML IV SOLN
INTRAVENOUS | Status: AC
  Filled 2023-10-27: qty 20

## 2023-10-27 MED ORDER — OXYCODONE HCL 5 MG PO TABS: 5.0000 mg | ORAL_TABLET | ORAL | 0 refills | Status: AC | PRN

## 2023-10-27 SURGICAL SUPPLY — 40 items
ANCHOR KTLS 1.8 MINI CO-B BLUE (Anchor) IMPLANT
ANCHOR QFIX KTLS 1.8 MINI BLUE (Anchor) ×3 IMPLANT
ANCHOR SUT BIOCOMP LK 2.9X12.5 (Anchor) IMPLANT
BIT DRILL FLX 1.8 FX SUT ANCH (DRILL) IMPLANT
BLADE SHAVER 4.5X7 STR FR (MISCELLANEOUS) ×1 IMPLANT
BUR BR 5.5 WIDE MOUTH (BURR) IMPLANT
CANNULA TWIST IN 8.25X7CM (CANNULA) IMPLANT
CHLORAPREP W/TINT 26 (MISCELLANEOUS) ×1 IMPLANT
COOLER POLAR GLACIER W/PUMP (MISCELLANEOUS) ×1 IMPLANT
COVER LIGHT HANDLE 1/PK (MISCELLANEOUS) ×3 IMPLANT
DRAPE IMP U-DRAPE 54X76 (DRAPES) ×1 IMPLANT
DRAPE STERI 35X30 U-POUCH (DRAPES) ×1 IMPLANT
DRAPE U-SHAPE 48X52 POLY STRL (PACKS) ×2 IMPLANT
GAUZE SPONGE 4X4 12PLY STRL (GAUZE/BANDAGES/DRESSINGS) ×1 IMPLANT
GAUZE XEROFORM 1X8 LF (GAUZE/BANDAGES/DRESSINGS) ×1 IMPLANT
GLOVE SRG 8 PF TXTR STRL LF DI (GLOVE) ×1 IMPLANT
GLOVE SURG ENC TEXT LTX SZ7.5 (GLOVE) ×1 IMPLANT
GLOVE SURG ORTHO LTX SZ8 (GLOVE) ×1 IMPLANT
GLOVE SURG SS PI 7.5 STRL IVOR (GLOVE) IMPLANT
GOWN STRL REIN 2XL XLG LVL4 (GOWN DISPOSABLE) ×1 IMPLANT
GOWN STRL REUS W/ TWL LRG LVL3 (GOWN DISPOSABLE) ×1 IMPLANT
GOWN STRL REUS W/ TWL XL LVL3 (GOWN DISPOSABLE) ×1 IMPLANT
IV LR IRRIG 3000ML ARTHROMATIC (IV SOLUTION) ×6 IMPLANT
KIT STABILIZATION SHOULDER (MISCELLANEOUS) ×1 IMPLANT
KIT TURNOVER KIT A (KITS) ×1 IMPLANT
MANIFOLD NEPTUNE II (INSTRUMENTS) ×1 IMPLANT
MASK FACE SPIDER DISP (MASK) ×1 IMPLANT
MAT ABSORB FLUID 56X50 GRAY (MISCELLANEOUS) ×2 IMPLANT
PACK ARTHROSCOPY SHOULDER (MISCELLANEOUS) ×1 IMPLANT
PAD WRAPON POLAR SHDR XLG (MISCELLANEOUS) ×1 IMPLANT
SET Y ADAPTER MULIT-BAG IRRIG (MISCELLANEOUS) ×2 IMPLANT
SPONGE T-LAP 18X18 ~~LOC~~+RFID (SPONGE) ×1 IMPLANT
SUT ETHILON 3-0 (SUTURE) IMPLANT
SUT LASSO 90 DEG CVD (SUTURE) IMPLANT
SYSTEM IMPL TENODESIS LNT 2.9 (Orthopedic Implant) ×1 IMPLANT
TAPE MICROFOAM 4IN (TAPE) ×1 IMPLANT
TUBE SET DOUBLEFLO INFLOW (TUBING) ×1 IMPLANT
TUBING OUTFLOW SET DBLFO PUMP (TUBING) ×1 IMPLANT
WAND WEREWOLF FLOW 90D (MISCELLANEOUS) ×1 IMPLANT
q-fix knotless anchor all suture (mini tape) cobra ×3 IMPLANT

## 2023-10-27 NOTE — Anesthesia Postprocedure Evaluation (Signed)
 Anesthesia Post Note  Patient: Mark Allison  Procedure(s) Performed: ARTHROSCOPY, SHOULDER, WITH GLENOID LABRUM REPAIR (Right: Shoulder)  Patient location during evaluation: PACU Anesthesia Type: General Level of consciousness: awake and alert Pain management: pain level controlled Vital Signs Assessment: post-procedure vital signs reviewed and stable Respiratory status: spontaneous breathing, nonlabored ventilation, respiratory function stable and patient connected to nasal cannula oxygen Cardiovascular status: blood pressure returned to baseline and stable Postop Assessment: no apparent nausea or vomiting Anesthetic complications: no   No notable events documented.   Last Vitals:  Vitals:   10/27/23 1219 10/27/23 1230  BP:  (!) 121/93  Pulse: 80 78  Resp: (!) 21 18  Temp:    SpO2: 100% 95%    Last Pain:  Vitals:   10/27/23 1226  TempSrc:   PainSc: 5                  Zamara Cozad C Leshonda Galambos

## 2023-10-27 NOTE — H&P (Signed)
 Paper H&P to be scanned into permanent record. H&P reviewed. No significant changes noted.

## 2023-10-27 NOTE — Discharge Instructions (Addendum)
 Post-Op Instructions  1. Bracing: You will wear a shoulder immobilizer or sling for at least 3 weeks. Total time will be determined by type of surgical repair and can be discussed at 1st postop appointment.  2. Driving: No driving for 3 weeks post-op. When driving, do not wear the immobilizer.  3. Activity: No active lifting for 2 months. Wrist, hand, and elbow motion only. Avoid lifting the upper arm away from the body except for hygiene. You are permitted to bend and straighten the elbow actively. You may use your hand and wrist for typing, writing, and managing utensils (cutting food). Do not lift more than a coffee cup for 8 weeks.  When sleeping or resting, inclined positions (recliner chair or wedge pillow) and a pillow under the forearm for support may provide better comfort for up to 4 weeks.  Avoid long distance travel for 4 weeks.  Return to normal activities after labral repair normally takes 6 months on average. If rehab goes very well, may be able to do most activities at 4 months, except overhead or contact sports.  4. Physical Therapy: Begins 3-4 days after surgery, and proceeds 2 times per week for the first 4 weeks, then 1-2 times per week from weeks 4-8 post-op.  5. Medications:  - You will be provided a prescription for narcotic pain medicine. After surgery, take 1-2 narcotic tablets every 4 hours if needed for severe pain.  - A prescription for anti-nausea medication will be provided in case the narcotic medicine causes nausea - take 1 tablet every 6 hours only if nauseated.   - Take tylenol  1000 mg every 8 hours for pain.  May stop tylenol  3 days after surgery if you are having minimal pain. - Take aspirin  325mg /day x 2 weeks to help prevent blood clots (DVT/PE).    If you are taking prescription medication for anxiety, depression, insomnia, muscle spasm, chronic pain, or for attention deficit disorder, you are advised that you are at a higher risk of adverse effects with use  of narcotics post-op, including narcotic addiction/dependence, depressed breathing, death. If you use non-prescribed substances: alcohol, marijuana, cocaine, heroin, methamphetamines, etc., you are at a higher risk of adverse effects with use of narcotics post-op, including narcotic addiction/dependence, depressed breathing, death. You are advised that taking > 50 morphine milligram equivalents (MME) of narcotic pain medication per day results in twice the risk of overdose or death. For your prescription provided: oxycodone  5 mg - taking more than 6 tablets per day would result in > 50 morphine milligram equivalents (MME) of narcotic pain medication. Be advised that we will prescribe narcotics short-term, for acute post-operative pain only - 3 weeks for major operations such as shoulder repair/reconstruction surgeries.   6. Post-Op Appointment:  Your first post-op appointment will be 10-14 days post-op.  7. Work or School: For most, but not all procedures, we advise staying out of work or school for at least 1 to 2 weeks in order to recover from the stress of surgery and to allow time for healing.   If you need a work or school note this can be provided.   Post-operative Brace: Apply and remove the brace you received as you were instructed to at the time of fitting and as described in detail as the brace's instructions for use indicate.  Wear the brace for the period of time prescribed by your physician.  The brace can be cleaned with soap and water and allowed to air dry only.  Should  the brace result in increased pain, decreased feeling (numbness/tingling), increased swelling or an overall worsening of your medical condition, please contact your doctor immediately.  If an emergency situation occurs as a result of wearing the brace after normal business hours, please dial 911 and seek immediate medical attention.  Let your doctor know if you have any further questions about the brace issued to  you. Refer to the shoulder sling instructions for use if you have any questions regarding the correct fit of your shoulder sling.  [facility] Customer Care for Troubleshooting: [phone]  Video that illustrates how to properly use a shoulder sling: Instructions for Proper Use of an Orthopaedic Sling http://bass.com/       Information for Discharge Teaching: EXPAREL  (bupivacaine  liposome injectable suspension)   Pain relief is important to your recovery. The goal is to control your pain so you can move easier and return to your normal activities as soon as possible after your procedure. Your physician may use several types of medicines to manage pain, swelling, and more.  Your surgeon or anesthesiologist gave you EXPAREL (bupivacaine ) to help control your pain after surgery.  EXPAREL  is a local anesthetic designed to release slowly over an extended period of time to provide pain relief by numbing the tissue around the surgical site. EXPAREL  is designed to release pain medication over time and can control pain for up to 72 hours. Depending on how you respond to EXPAREL , you may require less pain medication during your recovery. EXPAREL  can help reduce or eliminate the need for opioids during the first few days after surgery when pain relief is needed the most. EXPAREL  is not an opioid and is not addictive. It does not cause sleepiness or sedation.   Important! A teal colored band has been placed on your arm with the date, time and amount of EXPAREL  you have received. Please leave this armband in place for the full 96 hours following administration, and then you may remove the band. If you return to the hospital for any reason within 96 hours following the administration of EXPAREL , the armband provides important information that your health care providers to know, and alerts them that you have received this anesthetic.    Possible side effects of EXPAREL : Temporary  loss of sensation or ability to move in the area where medication was injected. Nausea, vomiting, constipation Rarely, numbness and tingling in your mouth or lips, lightheadedness, or anxiety may occur. Call your doctor right away if you think you may be experiencing any of these sensations, or if you have other questions regarding possible side effects.  Follow all other discharge instructions given to you by your surgeon or nurse. Eat a healthy diet and drink plenty of water or other fluids.Information for Discharge Teaching: EXPAREL  (bupivacaine  liposome injectable suspension)   Pain relief is important to your recovery. The goal is to control your pain so you can move easier and return to your normal activities as soon as possible after your procedure. Your physician may use several types of medicines to manage pain, swelling, and more.  Your surgeon or anesthesiologist gave you EXPAREL (bupivacaine ) to help control your pain after surgery.  EXPAREL  is a local anesthetic designed to release slowly over an extended period of time to provide pain relief by numbing the tissue around the surgical site. EXPAREL  is designed to release pain medication over time and can control pain for up to 72 hours. Depending on how you respond to EXPAREL , you may require less pain medication  during your recovery. EXPAREL  can help reduce or eliminate the need for opioids during the first few days after surgery when pain relief is needed the most. EXPAREL  is not an opioid and is not addictive. It does not cause sleepiness or sedation.   Important! A teal colored band has been placed on your arm with the date, time and amount of EXPAREL  you have received. Please leave this armband in place for the full 96 hours following administration, and then you may remove the band. If you return to the hospital for any reason within 96 hours following the administration of EXPAREL , the armband provides important information that  your health care providers to know, and alerts them that you have received this anesthetic.    Possible side effects of EXPAREL : Temporary loss of sensation or ability to move in the area where medication was injected. Nausea, vomiting, constipation Rarely, numbness and tingling in your mouth or lips, lightheadedness, or anxiety may occur. Call your doctor right away if you think you may be experiencing any of these sensations, or if you have other questions regarding possible side effects.  Follow all other discharge instructions given to you by your surgeon or nurse. Eat a healthy diet and drink plenty of water or other fluids.Information for Discharge Teaching: EXPAREL  (bupivacaine  liposome injectable suspension)   Pain relief is important to your recovery. The goal is to control your pain so you can move easier and return to your normal activities as soon as possible after your procedure. Your physician may use several types of medicines to manage pain, swelling, and more.  Your surgeon or anesthesiologist gave you EXPAREL (bupivacaine ) to help control your pain after surgery.  EXPAREL  is a local anesthetic designed to release slowly over an extended period of time to provide pain relief by numbing the tissue around the surgical site. EXPAREL  is designed to release pain medication over time and can control pain for up to 72 hours. Depending on how you respond to EXPAREL , you may require less pain medication during your recovery. EXPAREL  can help reduce or eliminate the need for opioids during the first few days after surgery when pain relief is needed the most. EXPAREL  is not an opioid and is not addictive. It does not cause sleepiness or sedation.   Important! A teal colored band has been placed on your arm with the date, time and amount of EXPAREL  you have received. Please leave this armband in place for the full 96 hours following administration, and then you may remove the band. If you  return to the hospital for any reason within 96 hours following the administration of EXPAREL , the armband provides important information that your health care providers to know, and alerts them that you have received this anesthetic.    Possible side effects of EXPAREL : Temporary loss of sensation or ability to move in the area where medication was injected. Nausea, vomiting, constipation Rarely, numbness and tingling in your mouth or lips, lightheadedness, or anxiety may occur. Call your doctor right away if you think you may be experiencing any of these sensations, or if you have other questions regarding possible side effects.  Follow all other discharge instructions given to you by your surgeon or nurse. Eat a healthy diet and drink plenty of water or other fluids.   POLAR CARE INFORMATION  MassAdvertisement.it  How to use [employer] Polar Care [facility] Therapy System?  YouTube   ShippingScam.co.uk  OPERATING INSTRUCTIONS  Start the product With dry hands, connect  the transformer to the electrical connection located on the top of the cooler. Next, plug the transformer into an appropriate electrical outlet. The unit will automatically start running at this point.  To stop the pump, disconnect electrical power.  Unplug to stop the product when not in use. Unplugging the Polar Care unit turns it off. Always unplug immediately after use. Never leave it plugged in while unattended. Remove pad.    FIRST ADD WATER TO FILL LINE, THEN ICE---Replace ice when existing ice is almost melted  1 Discuss Treatment with your Licensed Health Care Practitioner and Use Only as Prescribed 2 Apply Insulation Barrier & Cold Therapy Pad 3 Check for Moisture 4 Inspect Skin Regularly  Tips and Trouble Shooting Usage Tips 1. Use cubed or chunked ice for optimal performance. 2. It is recommended to drain the Pad between uses. To drain the pad, hold the Pad upright with the hose pointed  toward the ground. Depress the black plunger and allow water to drain out. 3. You may disconnect the Pad from the unit without removing the pad from the affected area by depressing the silver  tabs on the hose coupling and gently pulling the hoses apart. The Pad and unit will seal itself and will not leak. Note: Some dripping during release is normal. 4. DO NOT RUN PUMP WITHOUT WATER! The pump in this unit is designed to run with water. Running the unit without water will cause permanent damage to the pump. 5. Unplug unit before removing lid.  TROUBLESHOOTING GUIDE Pump not running, Water not flowing to the pad, Pad is not getting cold 1. Make sure the transformer is plugged into the wall outlet. 2. Confirm that the ice and water are filled to the indicated levels. 3. Make sure there are no kinks in the pad. 4. Gently pull on the blue tube to make sure the tube/pad junction is straight. 5. Remove the pad from the treatment site and ll it while the pad is lying at; then reapply. 6. Confirm that the pad couplings are securely attached to the unit. Listen for the double clicks (Figure 1) to confirm the pad couplings are securely attached.  Leaks    Note: Some condensation on the lines, controller, and pads is unavoidable, especially in warmer climates. 1. If using a [employer] Polar Care Cold Therapy unit with a detachable Cold Therapy Pad, and a leak exists (other than condensation on the lines) disconnect the pad couplings. Make sure the silver  tabs on the couplings are depressed before reconnecting the pad to the pump hose; then confirm both sides of the coupling are properly clicked in. 2. If the coupling continues to leak or a leak is detected in the pad itself, stop using it and call [employer] Customer Care at [phone].  Cleaning After use, empty and dry the unit with a soft cloth. Warm water and mild detergent may be used occasionally to clean the pump and tubes.  WARNING: The Polar Care Cube can  be cold enough to cause serious injury, including full skin necrosis. Follow these Operating Instructions, and carefully read the Product Insert (see pouch on side of unit) and the Cold Therapy Pad Fitting Instructions (provided with each Cold Therapy Pad) prior to use.       Information for Discharge Teaching: EXPAREL  (bupivacaine  liposome injectable suspension)   Pain relief is important to your recovery. The goal is to control your pain so you can move easier and return to your normal activities as soon as  possible after your procedure. Your physician may use several types of medicines to manage pain, swelling, and more.  Your surgeon or anesthesiologist gave you EXPAREL (bupivacaine ) to help control your pain after surgery.  EXPAREL  is a local anesthetic designed to release slowly over an extended period of time to provide pain relief by numbing the tissue around the surgical site. EXPAREL  is designed to release pain medication over time and can control pain for up to 72 hours. Depending on how you respond to EXPAREL , you may require less pain medication during your recovery. EXPAREL  can help reduce or eliminate the need for opioids during the first few days after surgery when pain relief is needed the most. EXPAREL  is not an opioid and is not addictive. It does not cause sleepiness or sedation.   Important! A teal colored band has been placed on your arm with the date, time and amount of EXPAREL  you have received. Please leave this armband in place for the full 96 hours following administration, and then you may remove the band. If you return to the hospital for any reason within 96 hours following the administration of EXPAREL , the armband provides important information that your health care providers to know, and alerts them that you have received this anesthetic.    Possible side effects of EXPAREL : Temporary loss of sensation or ability to move in the area where medication was  injected. Nausea, vomiting, constipation Rarely, numbness and tingling in your mouth or lips, lightheadedness, or anxiety may occur. Call your doctor right away if you think you may be experiencing any of these sensations, or if you have other questions regarding possible side effects.  Follow all other discharge instructions given to you by your surgeon or nurse. Eat a healthy diet and drink plenty of water or other fluids.

## 2023-10-27 NOTE — Anesthesia Procedure Notes (Signed)
 Procedure Name: LMA Insertion Date/Time: 10/27/2023 9:37 AM  Performed by: Levy Harvey, CRNAPre-anesthesia Checklist: Patient identified, Patient being monitored, Timeout performed, Emergency Drugs available and Suction available Patient Re-evaluated:Patient Re-evaluated prior to induction Oxygen Delivery Method: Circle system utilized Preoxygenation: Pre-oxygenation with 100% oxygen Induction Type: IV induction Ventilation: Mask ventilation without difficulty LMA: LMA inserted LMA Size: 5.0 Tube type: Oral Number of attempts: 1 Placement Confirmation: positive ETCO2 and breath sounds checked- equal and bilateral Tube secured with: Tape Dental Injury: Teeth and Oropharynx as per pre-operative assessment

## 2023-10-27 NOTE — Op Note (Signed)
 Operative Note    SURGERY DATE: 10/27/2023   PRE-OP DIAGNOSIS:  1. Right shoulder labral tear   POST-OP DIAGNOSIS:  1. Right shoulder anterior and posterior labral tear 2. Right shoulder SLAP tear   PROCEDURES:  1. Right shoulder arthroscopic circumferential labral repair 2. Right shoulder arthroscopic biceps tenodesis   SURGEON: Earnestine HILARIO Blanch, MD   ANESTHESIA: Regional + Gen   TOTAL IV FLUIDS: see anesthesia record   ESTIMATED BLOOD LOSS: minimal    DRAINS: None    SPECIMENS: None.    IMPLANTS:  - [employer] Knotless Qfix x 6 - Arthrex 2.97mm PushLock x 1  COMPLICATIONS: None    OPERATIVE FINDINGS:  Examination under anesthesia: A careful examination under anesthesia was performed.  Passive range of motion was: FF: 160; ER at side: 80; ER in abduction: 120; IR in abduction: 50.  Anterior load shift: 2+.  Posterior load shift: 1+.  Sulcus in neutral: 1+.  Sulcus in ER: 1+     Intra-operative findings: A thorough arthroscopic examination of the shoulder was performed.  The findings are: 1. Biceps tendon: normal 2. Superior labrum: Type II SLAP tear 3. Posterior labrum and capsule: Tear of the posterior labrum inferior to 8 o'clock position 4. Inferior capsule and inferior recess: No evidence of labral tissue inferiorly 5. Glenoid cartilage surface: Normal 6. Supraspinatus attachment: Partial-thickness articular sided tear affecting approximately 10% of the footprint anteriorly 7. Posterior rotator cuff attachment: normal 8. Humeral head articular cartilage: normal 9. Rotator interval: significant synovitis 10: Subscapularis tendon: attachment intact 11. Anterior labrum: Sublabral foramen present with significant loose tissue; no significant labral tissue from the 3 o'clock position to the 7 o'clock; patulous anterior capsule 12. IGHL: stretched   OPERATIVE REPORT:    Indications for procedure: Mark Allison is a 33 y.o. male with over 4 months of  shoulder pain after he felt a pop in the shoulder after lifting his daughter.  He has failed extensive nonoperative management including physical therapy, medications, and activity modifications.  He underwent diagnostic local anesthetic only glenohumeral joint injection.  He had complete relief of his preinjection pain symptoms with this injection.  Imaging was suggestive of possible anterior labral tear.    Procedure in detail: I identified Mark Allison in the pre-operative holding area. The risks, benefits, complications, treatment options, and expected outcomes were discussed with the patient. The risks and potential complications include, but are not limited to failure to fully relieve pain, infection, neurovascular compromise, complications from anesthesia, stiff shoulder, recurrent dislocation, and failure of surgery with continued pain. The patient concurred with the proposed plan, giving informed consent. I marked the operative shoulder with my initials.  Anesthesia was then performed with an interscalene block with Exparel .  The patient was transferred to the operative suite and placed in the beach chair position.     Appropriate IV antibiotics were administered prior to incision. The operative upper extremity was then prepped and draped in standard fashion. A time out was performed confirming the correct extremity, correct patient, and correct procedure.    I then created a standard posterior portal with an 11 blade. The glenohumeral joint was easily entered with a blunt trochar and the arthroscope introduced. A high anterior and lateral portal was made. The findings of diagnostic arthroscopy are described above. Next, an anterior inferior portal was made just lateral to the coracoid entering just above the subscapularis.  A threaded cannula was placed. I first debrided and then coagulated the inflamed  synovium about the rotator interval to obtain hemostasis and reduce the risk of  post-operative swelling using an Arthrocare radiofrequency device.  The anterior supraspinatus articular side was debrided with an oscillating shaver  I then turned my attention to the arthroscopic biceps tenodesis.  This was performed given the type II SLAP tear.  The Loop n Tack technique was used to pass a FiberTape through the biceps in a locked fashion adjacent to the biceps anchor.  A hole for a 2.9 mm Arthrex PushLock was drilled in the bicipital groove just superior to the subscapularis tendon insertion.  The biceps tendon was then cut and the biceps anchor complex was debrided down to a stable base on the superior labrum.  The FiberTape was loaded onto the PushLock anchor and impacted into place into the previously drilled hole in the bicipital groove.  This appropriately secured the biceps into the bicipital groove and took it off of tension.   Next, camera was then transitioned to the anterolateral portal.  A portal of [place] was made to better access the posterior and inferior labrum and a threaded cannula was placed.  An elevator was used to elevate the labrum off the glenoid from the 8:00 position posteriorly extending to the 3 o'clock position anteriorly. A rasp was used to roughen the glenoid for improvement of healing. A shaver was also used to debride degenerative labrum and further clean the glenoid face to allow for improved healing.  Through the portal [place], the curved drill guide for the knotless Qfix was placed at the 6:00 position.  This was drilled and anchor was placed.  The Arthrex SutureLasso  was then used to pass the nitinol wire loop through the capsule and under the labrum. This was passed in an inferior to superior fashion at the level of the anchor.  Repair suture was shuttled through the nitinol wire and then through the anchor using the passing suture.  This was cinched down and cut, nicely tightening the capsule and labrum onto the glenoid.  This sequence of events  was repeated for 2 additional anchors at the 7:00 and 8:00 positions.  Next, drill guide was placed through the low anterior portal and similarly anchors were placed and sutures were shuttled at the 5:00, 4:00, and 3:00 positions.  This allowed for appropriate labral repair and capsulorrhaphy.   Fluid was evacuated from the shoulder, and the portals were closed with 3-0 Nylon. Xeroform was applied to the portals. A sterile dressing was applied, followed by a Polar Care sleeve and a SlingShot shoulder immobilizer/sling. The patient awoke from anesthesia without difficulty and was transferred to the PACU in stable condition.    COMPLICATIONS: none   DISPOSITION: plan for discharge home after recovery in PACU   POSTOPERATIVE PLAN: Remain in sling (except hygiene and elbow/wrist/hand RoM exercises as instructed by PT) x 4 weeks and NWB for this time. PT to begin 3-4 days after surgery. Anterior shoulder stabilization/capsulorraphy protocol.

## 2023-10-27 NOTE — Anesthesia Preprocedure Evaluation (Addendum)
 Anesthesia Evaluation  Patient identified by MRN, date of birth, ID band Patient awake    Reviewed: Allergy & Precautions, H&P , NPO status , Patient's Chart, lab work & pertinent test results  Airway Mallampati: II  TM Distance: >3 FB Neck ROM: Full    Dental no notable dental hx.    Pulmonary neg pulmonary ROS, Patient abstained from smoking., former smoker   Pulmonary exam normal breath sounds clear to auscultation       Cardiovascular negative cardio ROS Normal cardiovascular exam Rhythm:Regular Rate:Normal     Neuro/Psych negative neurological ROS  negative psych ROS   GI/Hepatic negative GI ROS, Neg liver ROS,,,  Endo/Other  negative endocrine ROS    Renal/GU negative Renal ROS  negative genitourinary   Musculoskeletal negative musculoskeletal ROS (+)    Abdominal   Peds negative pediatric ROS (+)  Hematology negative hematology ROS (+)   Anesthesia Other Findings   Reproductive/Obstetrics negative OB ROS                              Anesthesia Physical Anesthesia Plan  ASA: 1  Anesthesia Plan: General   Post-op Pain Management:    Induction: Intravenous  PONV Risk Score and Plan:   Airway Management Planned: LMA  Additional Equipment:   Intra-op Plan:   Post-operative Plan: Extubation in OR  Informed Consent: I have reviewed the patients History and Physical, chart, labs and discussed the procedure including the risks, benefits and alternatives for the proposed anesthesia with the patient or authorized representative who has indicated his/her understanding and acceptance.     Dental Advisory Given  Plan Discussed with: Anesthesiologist, CRNA and Surgeon  Anesthesia Plan Comments: (Patient consented for risks of anesthesia including but not limited to:  - adverse reactions to medications - damage to eyes, teeth, lips or other oral mucosa - nerve damage due to  positioning  - sore throat or hoarseness - Damage to heart, brain, nerves, lungs, other parts of body or loss of life  Patient voiced understanding and assent.)         Anesthesia Quick Evaluation

## 2023-10-27 NOTE — Anesthesia Procedure Notes (Addendum)
 Anesthesia Regional Block: Interscalene brachial plexus block   Pre-Anesthetic Checklist: , timeout performed,  Correct Patient, Correct Site, Correct Laterality,  Correct Procedure, Correct Position, site marked,  Risks and benefits discussed,  Surgical consent,  Pre-op evaluation,  At surgeon's request and post-op pain management  Laterality: Right and Upper  Prep: chloraprep       Needles:  Injection technique: Single-shot  Needle Type: Stimiplex     Needle Length: 10cm  Needle Gauge: 21     Additional Needles:   Procedures:,,,, ultrasound used (permanent image in chart),,    Narrative:  Start time: 10/27/2023 8:53 AM End time: 10/27/2023 9:06 AM Injection made incrementally with aspirations every 5 mL.  Performed by: Personally  Anesthesiologist: Ola Donny BROCKS, MD  Additional Notes: Functioning IV was confirmed and monitors applied. Ultrasound guidance: relevant anatomy identified, needle position confirmed, local anesthetic spread visualized around nerve(s)., vascular puncture avoided.  Image printed for medical record.  Negative aspiration and no paresthesias; incremental administration of local anesthetic. The patient tolerated the procedure well. Vitals signs recorded in RN notes. Also superficial cervical plexus block and intercostobrachial block.

## 2023-10-27 NOTE — Transfer of Care (Signed)
 Immediate Anesthesia Transfer of Care Note  Patient: Mark Allison  Procedure(s) Performed: ARTHROSCOPY, SHOULDER, WITH GLENOID LABRUM REPAIR (Right: Shoulder)  Patient Location: PACU  Anesthesia Type: General  Level of Consciousness: awake, alert  and patient cooperative  Airway and Oxygen Therapy: Patient Spontanous Breathing and Patient connected to supplemental oxygen  Post-op Assessment: Post-op Vital signs reviewed, Patient's Cardiovascular Status Stable, Respiratory Function Stable, Patent Airway and No signs of Nausea or vomiting  Post-op Vital Signs: Reviewed and stable  Complications: No notable events documented.

## 2023-10-30 ENCOUNTER — Encounter: Payer: Self-pay | Admitting: Orthopedic Surgery

## 2023-11-01 ENCOUNTER — Encounter: Payer: Self-pay | Admitting: Orthopedic Surgery

## 2023-11-02 ENCOUNTER — Encounter: Payer: Self-pay | Admitting: Orthopedic Surgery

## 2023-11-09 ENCOUNTER — Encounter: Payer: Self-pay | Admitting: Orthopedic Surgery
